# Patient Record
Sex: Male | Born: 1965 | Race: White | Hispanic: No | Smoking: Former smoker
Health system: Southern US, Community
[De-identification: ages and names within clinical notes are randomized; demographics above are authoritative.]

## PROBLEM LIST (undated history)

## (undated) DIAGNOSIS — Z8674 Personal history of sudden cardiac arrest: Secondary | ICD-10-CM

## (undated) DIAGNOSIS — F909 Attention-deficit hyperactivity disorder, unspecified type: Secondary | ICD-10-CM

## (undated) DIAGNOSIS — M419 Scoliosis, unspecified: Secondary | ICD-10-CM

## (undated) DIAGNOSIS — F319 Bipolar disorder, unspecified: Secondary | ICD-10-CM

## (undated) DIAGNOSIS — M1990 Unspecified osteoarthritis, unspecified site: Secondary | ICD-10-CM

## (undated) DIAGNOSIS — J189 Pneumonia, unspecified organism: Secondary | ICD-10-CM

## (undated) DIAGNOSIS — I209 Angina pectoris, unspecified: Secondary | ICD-10-CM

## (undated) DIAGNOSIS — I10 Essential (primary) hypertension: Secondary | ICD-10-CM

## (undated) DIAGNOSIS — S0990XA Unspecified injury of head, initial encounter: Secondary | ICD-10-CM

## (undated) DIAGNOSIS — K469 Unspecified abdominal hernia without obstruction or gangrene: Secondary | ICD-10-CM

## (undated) DIAGNOSIS — R51 Headache: Secondary | ICD-10-CM

## (undated) DIAGNOSIS — F32A Depression, unspecified: Secondary | ICD-10-CM

## (undated) DIAGNOSIS — Z8614 Personal history of Methicillin resistant Staphylococcus aureus infection: Secondary | ICD-10-CM

## (undated) DIAGNOSIS — H9192 Unspecified hearing loss, left ear: Secondary | ICD-10-CM

## (undated) DIAGNOSIS — F419 Anxiety disorder, unspecified: Secondary | ICD-10-CM

## (undated) DIAGNOSIS — K219 Gastro-esophageal reflux disease without esophagitis: Secondary | ICD-10-CM

## (undated) DIAGNOSIS — F329 Major depressive disorder, single episode, unspecified: Secondary | ICD-10-CM

## (undated) DIAGNOSIS — R569 Unspecified convulsions: Secondary | ICD-10-CM

## (undated) DIAGNOSIS — E785 Hyperlipidemia, unspecified: Secondary | ICD-10-CM

## (undated) HISTORY — PX: HERNIA REPAIR: SHX51

---

## 2012-03-20 ENCOUNTER — Emergency Department (HOSPITAL_BASED_OUTPATIENT_CLINIC_OR_DEPARTMENT_OTHER)
Admission: EM | Admit: 2012-03-20 | Discharge: 2012-03-20 | Disposition: A | Discharge status: Home / Self Care | Payer: Medicare Other | Attending: Emergency Medicine | Admitting: Emergency Medicine

## 2012-03-20 ENCOUNTER — Encounter (HOSPITAL_BASED_OUTPATIENT_CLINIC_OR_DEPARTMENT_OTHER): Payer: Self-pay | Admitting: *Deleted

## 2012-03-20 DIAGNOSIS — K219 Gastro-esophageal reflux disease without esophagitis: Secondary | ICD-10-CM | POA: Insufficient documentation

## 2012-03-20 DIAGNOSIS — F17200 Nicotine dependence, unspecified, uncomplicated: Secondary | ICD-10-CM | POA: Insufficient documentation

## 2012-03-20 DIAGNOSIS — Z79899 Other long term (current) drug therapy: Secondary | ICD-10-CM | POA: Insufficient documentation

## 2012-03-20 DIAGNOSIS — K0889 Other specified disorders of teeth and supporting structures: Secondary | ICD-10-CM

## 2012-03-20 DIAGNOSIS — K089 Disorder of teeth and supporting structures, unspecified: Secondary | ICD-10-CM | POA: Insufficient documentation

## 2012-03-20 DIAGNOSIS — Z8659 Personal history of other mental and behavioral disorders: Secondary | ICD-10-CM | POA: Insufficient documentation

## 2012-03-20 DIAGNOSIS — R6884 Jaw pain: Secondary | ICD-10-CM | POA: Insufficient documentation

## 2012-03-20 DIAGNOSIS — I10 Essential (primary) hypertension: Secondary | ICD-10-CM | POA: Insufficient documentation

## 2012-03-20 DIAGNOSIS — F319 Bipolar disorder, unspecified: Secondary | ICD-10-CM | POA: Insufficient documentation

## 2012-03-20 DIAGNOSIS — Z8639 Personal history of other endocrine, nutritional and metabolic disease: Secondary | ICD-10-CM | POA: Insufficient documentation

## 2012-03-20 DIAGNOSIS — Z862 Personal history of diseases of the blood and blood-forming organs and certain disorders involving the immune mechanism: Secondary | ICD-10-CM | POA: Insufficient documentation

## 2012-03-20 HISTORY — DX: Bipolar disorder, unspecified: F31.9

## 2012-03-20 HISTORY — DX: Gastro-esophageal reflux disease without esophagitis: K21.9

## 2012-03-20 HISTORY — DX: Essential (primary) hypertension: I10

## 2012-03-20 HISTORY — DX: Attention-deficit hyperactivity disorder, unspecified type: F90.9

## 2012-03-20 HISTORY — DX: Hyperlipidemia, unspecified: E78.5

## 2012-03-20 MED ORDER — HYDROCODONE-ACETAMINOPHEN 5-325 MG PO TABS: 2.0000 | ORAL_TABLET | ORAL | Status: DC | PRN

## 2012-03-20 MED ORDER — PENICILLIN V POTASSIUM 500 MG PO TABS: 500.0000 mg | ORAL_TABLET | Freq: Four times a day (QID) | ORAL | Status: AC

## 2012-03-20 NOTE — ED Provider Notes (Signed)
History     CSN: [id number]  Arrival date & time 03/20/12  1638   First MD Initiated Contact with Patient 03/20/12 1709      Chief Complaint  Patient presents with  . Dental Pain    (Consider location/radiation/quality/duration/timing/severity/associated sxs/prior treatment) Patient is a 47 y.o. male presenting with tooth pain. The history is provided by the patient. No language interpreter was used.  Dental PainThe primary symptoms include mouth pain and dental injury. The symptoms began 3 to 5 days ago. The symptoms are improving. The symptoms occur constantly.  Additional symptoms include: jaw pain.  Pt complains of a toothache  Past Medical History  Diagnosis Date  . Bipolar 1 disorder   . ADHD (attention deficit hyperactivity disorder)   . Hyperlipemia   . GERD (gastroesophageal reflux disease)   . Hypertension     Past Surgical History  Procedure Laterality Date  . Hernia repair      History reviewed. No pertinent family history.  History  Substance Use Topics  . Smoking status: Current Every Day Smoker -- 1.00 packs/day    Types: Cigarettes  . Smokeless tobacco: Not on file  . Alcohol Use: No      Review of Systems  HENT: Positive for dental problem.   All other systems reviewed and are negative.    Allergies  Naproxen  Home Medications   Current Outpatient Rx  Name  Route  Sig  Dispense  Refill  . cyclobenzaprine (FLEXERIL) 10 MG tablet   Oral   Take 10 mg by mouth 3 (three) times daily as needed for muscle spasms.         . divalproex (DEPAKOTE) 250 MG DR tablet   Oral   Take 250 mg by mouth 3 (three) times daily.         Marland Kitchen lisinopril (PRINIVIL,ZESTRIL) 20 MG tablet   Oral   Take 20 mg by mouth daily.         . mirtazapine (REMERON) 30 MG tablet   Oral   Take 30 mg by mouth at bedtime.         Marland Kitchen omeprazole (PRILOSEC) 20 MG capsule   Oral   Take 20 mg by mouth daily.           BP 132/88  Pulse 89  Temp(Src) 97.8 F  (36.6 C) (Oral)  Resp 16  Ht 6\' 5"  (1.956 m)  Wt 240 lb (108.863 kg)  BMI 28.45 kg/m2  SpO2 99%  Physical Exam  Nursing note and vitals reviewed. Constitutional: He appears well-developed and well-nourished.  HENT:  Head: Normocephalic and atraumatic.  Gum is erroded away from root of tooth, frontal incisor  Eyes: Pupils are equal, round, and reactive to light.  Cardiovascular: Normal rate.   Pulmonary/Chest: Effort normal.  Neurological: He is alert.    ED Course  Procedures (including critical care time)  Labs Reviewed - No data to display No results found.   No diagnosis found.    MDM  Pt advised he needs dental care.   Pt given number for Dr. Lucky Cowboy.   Pt given rx for hydrocodone and pcn.      Lonia Skinner [place], [place] 03/20/12 1734

## 2012-03-20 NOTE — ED Notes (Signed)
Pt c/o toothache x 3 days. 

## 2012-03-20 NOTE — ED Provider Notes (Signed)
Medical screening examination/treatment/procedure(s) were performed by non-physician practitioner and as supervising physician I was immediately available for consultation/collaboration.   Dione Booze, MD 03/20/12 2258

## 2012-05-12 ENCOUNTER — Emergency Department (HOSPITAL_COMMUNITY): Payer: Medicare Other | Admitting: Anesthesiology

## 2012-05-12 ENCOUNTER — Encounter (HOSPITAL_COMMUNITY)
Admission: EM | Disposition: A | Discharge status: Home / Self Care | Payer: Self-pay | Source: Home / Self Care | Attending: Emergency Medicine

## 2012-05-12 ENCOUNTER — Emergency Department (HOSPITAL_BASED_OUTPATIENT_CLINIC_OR_DEPARTMENT_OTHER): Payer: Medicare Other

## 2012-05-12 ENCOUNTER — Encounter (HOSPITAL_BASED_OUTPATIENT_CLINIC_OR_DEPARTMENT_OTHER): Payer: Self-pay | Admitting: Emergency Medicine

## 2012-05-12 ENCOUNTER — Encounter (HOSPITAL_COMMUNITY): Payer: Self-pay | Admitting: Anesthesiology

## 2012-05-12 ENCOUNTER — Observation Stay (HOSPITAL_BASED_OUTPATIENT_CLINIC_OR_DEPARTMENT_OTHER)
Admission: EM | Admit: 2012-05-12 | Discharge: 2012-05-13 | Disposition: A | Discharge status: Home / Self Care | Payer: Medicare Other | Attending: General Surgery | Admitting: General Surgery

## 2012-05-12 DIAGNOSIS — K3580 Unspecified acute appendicitis: Principal | ICD-10-CM

## 2012-05-12 DIAGNOSIS — Z79899 Other long term (current) drug therapy: Secondary | ICD-10-CM | POA: Insufficient documentation

## 2012-05-12 DIAGNOSIS — F17200 Nicotine dependence, unspecified, uncomplicated: Secondary | ICD-10-CM | POA: Insufficient documentation

## 2012-05-12 DIAGNOSIS — F909 Attention-deficit hyperactivity disorder, unspecified type: Secondary | ICD-10-CM | POA: Insufficient documentation

## 2012-05-12 DIAGNOSIS — I10 Essential (primary) hypertension: Secondary | ICD-10-CM | POA: Insufficient documentation

## 2012-05-12 DIAGNOSIS — K219 Gastro-esophageal reflux disease without esophagitis: Secondary | ICD-10-CM | POA: Insufficient documentation

## 2012-05-12 DIAGNOSIS — K4090 Unilateral inguinal hernia, without obstruction or gangrene, not specified as recurrent: Secondary | ICD-10-CM | POA: Insufficient documentation

## 2012-05-12 DIAGNOSIS — E785 Hyperlipidemia, unspecified: Secondary | ICD-10-CM | POA: Insufficient documentation

## 2012-05-12 DIAGNOSIS — F319 Bipolar disorder, unspecified: Secondary | ICD-10-CM | POA: Insufficient documentation

## 2012-05-12 DIAGNOSIS — R5381 Other malaise: Secondary | ICD-10-CM | POA: Insufficient documentation

## 2012-05-12 HISTORY — PX: LAPAROSCOPIC APPENDECTOMY: SHX408

## 2012-05-12 HISTORY — DX: Unspecified injury of head, initial encounter: S09.90XA

## 2012-05-12 LAB — URINE MICROSCOPIC-ADD ON

## 2012-05-12 LAB — CBC WITH DIFFERENTIAL/PLATELET
Lymphocytes Relative: 13 % (ref 12–46)
Lymphs Abs: 2.6 10*3/uL (ref 0.7–4.0)
Neutrophils Relative %: 80 % — ABNORMAL HIGH (ref 43–77)
Platelets: 262 10*3/uL (ref 150–400)
RBC: 5.25 MIL/uL (ref 4.22–5.81)
WBC: 20.6 10*3/uL — ABNORMAL HIGH (ref 4.0–10.5)

## 2012-05-12 LAB — BASIC METABOLIC PANEL
CO2: 27 mEq/L (ref 19–32)
GFR calc non Af Amer: 72 mL/min — ABNORMAL LOW (ref >90)
Glucose, Bld: 127 mg/dL — ABNORMAL HIGH (ref 70–99)
Potassium: 4.2 mEq/L (ref 3.5–5.1)
Sodium: 138 mEq/L (ref 135–145)

## 2012-05-12 LAB — URINALYSIS, ROUTINE W REFLEX MICROSCOPIC
Glucose, UA: NEGATIVE mg/dL
Hgb urine dipstick: NEGATIVE
Specific Gravity, Urine: 1.026 (ref 1.005–1.030)
Urobilinogen, UA: 0.2 mg/dL (ref 0.0–1.0)

## 2012-05-12 SURGERY — APPENDECTOMY, LAPAROSCOPIC
Anesthesia: General | Laterality: Right | Wound class: Clean

## 2012-05-12 MED ORDER — HYDROMORPHONE HCL PF 1 MG/ML IJ SOLN
1.0000 mg | Freq: Once | INTRAMUSCULAR | Status: AC
  Administered 2012-05-12: 1 mg via INTRAVENOUS
  Filled 2012-05-12: qty 1

## 2012-05-12 MED ORDER — DEXAMETHASONE SODIUM PHOSPHATE 10 MG/ML IJ SOLN
INTRAMUSCULAR | Status: DC | PRN
  Administered 2012-05-12: 10 mg via INTRAVENOUS

## 2012-05-12 MED ORDER — BUPIVACAINE-EPINEPHRINE 0.5% -1:200000 IJ SOLN
INTRAMUSCULAR | Status: AC
  Filled 2012-05-12: qty 1

## 2012-05-12 MED ORDER — MIDAZOLAM HCL 5 MG/5ML IJ SOLN
INTRAMUSCULAR | Status: DC | PRN
  Administered 2012-05-12: 2 mg via INTRAVENOUS

## 2012-05-12 MED ORDER — BUPIVACAINE-EPINEPHRINE 0.25% -1:200000 IJ SOLN
INTRAMUSCULAR | Status: AC
  Filled 2012-05-12: qty 1

## 2012-05-12 MED ORDER — FENTANYL CITRATE 0.05 MG/ML IJ SOLN
INTRAMUSCULAR | Status: DC | PRN
  Administered 2012-05-12: 100 ug via INTRAVENOUS
  Administered 2012-05-12: 50 ug via INTRAVENOUS

## 2012-05-12 MED ORDER — LACTATED RINGERS IV SOLN
INTRAVENOUS | Status: DC | PRN
  Administered 2012-05-12 – 2012-05-13 (×2): via INTRAVENOUS

## 2012-05-12 MED ORDER — BUPIVACAINE-EPINEPHRINE 0.5% -1:200000 IJ SOLN
INTRAMUSCULAR | Status: DC | PRN
  Administered 2012-05-12: 15 mL

## 2012-05-12 MED ORDER — ONDANSETRON HCL 4 MG/2ML IJ SOLN
INTRAMUSCULAR | Status: DC | PRN
  Administered 2012-05-12: 4 mg via INTRAVENOUS

## 2012-05-12 MED ORDER — PROPOFOL 10 MG/ML IV BOLUS
INTRAVENOUS | Status: DC | PRN
  Administered 2012-05-12: 200 mg via INTRAVENOUS

## 2012-05-12 MED ORDER — IOHEXOL 300 MG/ML  SOLN
50.0000 mL | Freq: Once | INTRAMUSCULAR | Status: AC | PRN
  Administered 2012-05-12: 50 mL via ORAL

## 2012-05-12 MED ORDER — SODIUM CHLORIDE 0.9 % IR SOLN
Status: DC | PRN
  Administered 2012-05-12: 1000 mL

## 2012-05-12 MED ORDER — CISATRACURIUM BESYLATE (PF) 10 MG/5ML IV SOLN
INTRAVENOUS | Status: DC | PRN
  Administered 2012-05-12: 5 mg via INTRAVENOUS

## 2012-05-12 MED ORDER — IOHEXOL 300 MG/ML  SOLN
100.0000 mL | Freq: Once | INTRAMUSCULAR | Status: AC | PRN
  Administered 2012-05-12: 100 mL via INTRAVENOUS

## 2012-05-12 MED ORDER — ONDANSETRON HCL 4 MG/2ML IJ SOLN
4.0000 mg | Freq: Once | INTRAMUSCULAR | Status: AC
  Administered 2012-05-12: 4 mg via INTRAVENOUS
  Filled 2012-05-12: qty 2

## 2012-05-12 MED ORDER — HYDROMORPHONE HCL PF 1 MG/ML IJ SOLN
INTRAMUSCULAR | Status: AC
  Filled 2012-05-12: qty 1

## 2012-05-12 MED ORDER — 0.9 % SODIUM CHLORIDE (POUR BTL) OPTIME
TOPICAL | Status: DC | PRN
  Administered 2012-05-12: 1000 mL

## 2012-05-12 MED ORDER — DEXTROSE 5 % IV SOLN
2.0000 g | INTRAVENOUS | Status: AC
  Administered 2012-05-12: 2 g via INTRAVENOUS
  Filled 2012-05-12 (×2): qty 2

## 2012-05-12 MED ORDER — HYDROMORPHONE HCL PF 1 MG/ML IJ SOLN
1.0000 mg | Freq: Once | INTRAMUSCULAR | Status: AC
  Administered 2012-05-12: 1 mg via INTRAVENOUS

## 2012-05-12 MED ORDER — SUCCINYLCHOLINE CHLORIDE 20 MG/ML IJ SOLN
INTRAMUSCULAR | Status: DC | PRN
  Administered 2012-05-12: 100 mg via INTRAVENOUS

## 2012-05-12 MED ORDER — SODIUM CHLORIDE 0.9 % IV BOLUS (SEPSIS)
1000.0000 mL | Freq: Once | INTRAVENOUS | Status: AC
  Administered 2012-05-12: 1000 mL via INTRAVENOUS

## 2012-05-12 SURGICAL SUPPLY — 38 items
APPLIER CLIP 5 13 M/L LIGAMAX5 (MISCELLANEOUS)
APPLIER CLIP ROT 10 11.4 M/L (STAPLE)
CANISTER SUCTION 2500CC (MISCELLANEOUS) ×2 IMPLANT
CHLORAPREP W/TINT 26ML (MISCELLANEOUS) ×2 IMPLANT
CLIP APPLIE 5 13 M/L LIGAMAX5 (MISCELLANEOUS) IMPLANT
CLIP APPLIE ROT 10 11.4 M/L (STAPLE) IMPLANT
CLOTH BEACON ORANGE TIMEOUT ST (SAFETY) ×2 IMPLANT
CORD HIGH FREQUENCY UNIPOLAR (ELECTROSURGICAL) ×2 IMPLANT
CUTTER FLEX LINEAR 45M (STAPLE) ×2 IMPLANT
DECANTER SPIKE VIAL GLASS SM (MISCELLANEOUS) ×2 IMPLANT
DERMABOND ADVANCED (GAUZE/BANDAGES/DRESSINGS)
DERMABOND ADVANCED .7 DNX12 (GAUZE/BANDAGES/DRESSINGS) IMPLANT
DRAPE LAPAROSCOPIC ABDOMINAL (DRAPES) ×2 IMPLANT
DRAPE UTILITY W/TAPE 26X15 (DRAPES) ×2 IMPLANT
DRAPE UTILITY XL STRL (DRAPES) ×2 IMPLANT
ELECT REM PT RETURN 9FT ADLT (ELECTROSURGICAL) ×2
ELECTRODE REM PT RTRN 9FT ADLT (ELECTROSURGICAL) ×1 IMPLANT
GLOVE BIO SURGEON STRL SZ 6.5 (GLOVE) ×2 IMPLANT
GLOVE BIOGEL PI IND STRL 7.0 (GLOVE) ×1 IMPLANT
GLOVE BIOGEL PI INDICATOR 7.0 (GLOVE) ×1
GOWN STRL REIN 2XL XLG LVL4 (GOWN DISPOSABLE) ×2 IMPLANT
GOWN STRL REIN XL XLG (GOWN DISPOSABLE) ×2 IMPLANT
IV LACTATED RINGERS 1000ML (IV SOLUTION) ×2 IMPLANT
KIT BASIN OR (CUSTOM PROCEDURE TRAY) ×2 IMPLANT
POUCH SPECIMEN RETRIEVAL 10MM (ENDOMECHANICALS) ×2 IMPLANT
RELOAD 45 VASCULAR/THIN (ENDOMECHANICALS) ×2 IMPLANT
RELOAD STAPLE TA45 3.5 REG BLU (ENDOMECHANICALS) ×2 IMPLANT
SCALPEL HARMONIC ACE (MISCELLANEOUS) IMPLANT
SET IRRIG TUBING LAPAROSCOPIC (IRRIGATION / IRRIGATOR) ×2 IMPLANT
SOLUTION ANTI FOG 6CC (MISCELLANEOUS) ×2 IMPLANT
SUT MNCRL AB 4-0 PS2 18 (SUTURE) IMPLANT
TOWEL OR 17X26 10 PK STRL BLUE (TOWEL DISPOSABLE) ×2 IMPLANT
TRAY FOLEY CATH 14FRSI W/METER (CATHETERS) ×2 IMPLANT
TRAY LAP CHOLE (CUSTOM PROCEDURE TRAY) ×2 IMPLANT
TROCAR BLADELESS OPT 5 75 (ENDOMECHANICALS) ×4 IMPLANT
TROCAR XCEL 12X100 BLDLESS (ENDOMECHANICALS) IMPLANT
TROCAR XCEL BLUNT TIP 100MML (ENDOMECHANICALS) ×2 IMPLANT
TUBING INSUFFLATION 10FT LAP (TUBING) ×2 IMPLANT

## 2012-05-12 NOTE — ED Notes (Signed)
Report to [facility] 

## 2012-05-12 NOTE — ED Provider Notes (Signed)
Patient received in transfer from med center [facility], feels ongoing right lower quadrant pain, general surgery to be paged, the patient appears well otherwise.  Vida Roller, MD 05/12/12 2046

## 2012-05-12 NOTE — ED Notes (Signed)
Pt requested pain med-EDP notified-orders received

## 2012-05-12 NOTE — ED Notes (Signed)
abd pain 6/10-EDP notified-orders received for pain med

## 2012-05-12 NOTE — H&P (Signed)
Miguel Aguilar is an 47 y.o. male.   Chief Complaint: RLQ pain HPI: Pt reports waking up with abd pain that worsened and located to his RLQ this afternoon.  He denies any fevers or chills, nausea, vomiting or dysuria.  Past Medical History  Diagnosis Date  . Bipolar 1 disorder   . ADHD (attention deficit hyperactivity disorder)   . Hyperlipemia   . GERD (gastroesophageal reflux disease)   . Hypertension   . Head trauma approx 1982    Past Surgical History  Procedure Laterality Date  . Hernia repair      total of 12 surgeries    History reviewed. No pertinent family history. Social History:  reports that he has been smoking Cigarettes.  He has a 9 pack-year smoking history. He quit smokeless tobacco use about 19 years ago. His smokeless tobacco use included Chew. He reports that he uses illicit drugs (Marijuana). He reports that he does not drink alcohol.  Allergies:  Allergies  Allergen Reactions  . Aspirin Anaphylaxis  . Naproxen Anaphylaxis  . Ibuprofen      (Not in a hospital admission)  Results for orders placed during the hospital encounter of 05/12/12 (from the past 48 hour(s))  CBC WITH DIFFERENTIAL     Status: Abnormal   Collection Time    05/12/12  5:40 PM      Result Value Range   WBC 20.6 (*) 4.0 - 10.5 K/uL   RBC 5.25  4.22 - 5.81 MIL/uL   Hemoglobin 16.6  13.0 - 17.0 g/dL   HCT 16.1  09.6 - 04.5 %   MCV 90.7  78.0 - 100.0 fL   MCH 31.6  26.0 - 34.0 pg   MCHC 34.9  30.0 - 36.0 g/dL   RDW 40.9  81.1 - 91.4 %   Platelets 262  150 - 400 K/uL   Neutrophils Relative 80 (*) 43 - 77 %   Neutro Abs 16.3 (*) 1.7 - 7.7 K/uL   Lymphocytes Relative 13  12 - 46 %   Lymphs Abs 2.6  0.7 - 4.0 K/uL   Monocytes Relative 8  3 - 12 %   Monocytes Absolute 1.6 (*) 0.1 - 1.0 K/uL   Eosinophils Relative 0  0 - 5 %   Eosinophils Absolute 0.1  0.0 - 0.7 K/uL   Basophils Relative 0  0 - 1 %   Basophils Absolute 0.0  0.0 - 0.1 K/uL  BASIC METABOLIC PANEL     Status:  Abnormal   Collection Time    05/12/12  5:40 PM      Result Value Range   Sodium 138  135 - 145 mEq/L   Potassium 4.2  3.5 - 5.1 mEq/L   Chloride 99  96 - 112 mEq/L   CO2 27  19 - 32 mEq/L   Glucose, Bld 127 (*) 70 - 99 mg/dL   BUN 16  6 - 23 mg/dL   Creatinine, Ser 7.82  0.50 - 1.35 mg/dL   Calcium 95.6  8.4 - 21.3 mg/dL   GFR calc non Af Amer 72 (*) >90 mL/min   GFR calc Af Amer 83 (*) >90 mL/min   Comment:            The eGFR has been calculated     using the CKD EPI equation.     This calculation has not been     validated in all clinical     situations.     eGFR's persistently     <  90 mL/min signify     possible Chronic Kidney Disease.  URINALYSIS, ROUTINE W REFLEX MICROSCOPIC     Status: Abnormal   Collection Time    05/12/12  6:00 PM      Result Value Range   Color, Urine YELLOW  YELLOW   APPearance CLEAR  CLEAR   Specific Gravity, Urine 1.026  1.005 - 1.030   pH 5.0  5.0 - 8.0   Glucose, UA NEGATIVE  NEGATIVE mg/dL   Hgb urine dipstick NEGATIVE  NEGATIVE   Bilirubin Urine NEGATIVE  NEGATIVE   Ketones, ur NEGATIVE  NEGATIVE mg/dL   Protein, ur NEGATIVE  NEGATIVE mg/dL   Urobilinogen, UA 0.2  0.0 - 1.0 mg/dL   Nitrite NEGATIVE  NEGATIVE   Leukocytes, UA TRACE (*) NEGATIVE  URINE MICROSCOPIC-ADD ON     Status: None   Collection Time    05/12/12  6:00 PM      Result Value Range   Squamous Epithelial / LPF RARE  RARE   WBC, UA 0-2  <3 WBC/hpf   RBC / HPF 0-2  <3 RBC/hpf   Bacteria, UA RARE  RARE   Urine-Other MUCOUS PRESENT     Ct Abdomen Pelvis W Contrast  05/12/2012  *RADIOLOGY REPORT*  Clinical Data: Right lower quadrant abdominal pain and history of prior inguinal hernia repair.  CT ABDOMEN AND PELVIS WITH CONTRAST  Technique:  Multidetector CT imaging of the abdomen and pelvis was performed following the standard protocol during bolus administration of intravenous contrast.  Contrast: 50mL OMNIPAQUE IOHEXOL 300 MG/ML  SOLN, OMNIPAQUE IOHEXOL 300 MG/ML   SOLN  Comparison: None.  Findings: There is evidence of thickening of the appendix with mild surrounding inflammatory changes consistent with appendicitis. Maximal diameter of the appendix is 11 mm.  No evidence of appendiceal rupture by CT or focal abscess.  No appendicolith is identified.  No extraluminal free air is identified.  Other bowel loops are unremarkable.  The liver, gallbladder, pancreas, spleen, adrenal glands and kidneys are within normal limits.  No free fluid or focal abscess is identified.  In the pelvis, the bladder is unremarkable.  There is a small right inguinal hernia containing fat.  Bony structures are unremarkable.  IMPRESSION: Thickening of the appendix to 11 mm with mild surrounding inflammatory changes.  Findings are consistent with appendicitis. There is no evidence of focal rupture or abscess.   Original Report Authenticated By: Irish Lack, M.D.     Review of Systems  Constitutional: Positive for malaise/fatigue. Negative for fever and chills.  Eyes: Negative for blurred vision.  Respiratory: Negative for cough and shortness of breath.   Cardiovascular: Negative for chest pain and palpitations.  Gastrointestinal: Positive for abdominal pain. Negative for nausea, vomiting, diarrhea and constipation.  Genitourinary: Negative for dysuria, frequency and hematuria.  Skin: Negative for rash.  Neurological: Negative for headaches.    Blood pressure 129/74, pulse 72, temperature 98.3 F (36.8 C), temperature source Oral, resp. rate 18, height 6\' 5"  (1.956 m), weight 238 lb (107.956 kg), SpO2 94.00%. Physical Exam  Constitutional: He is oriented to person, place, and time. He appears well-developed and well-nourished. No distress.  HENT:  Head: Normocephalic and atraumatic.  Eyes: Conjunctivae are normal. Pupils are equal, round, and reactive to light.  Neck: Normal range of motion.  Cardiovascular: Normal rate and regular rhythm.   Respiratory: Effort normal.  GI:  Soft. Bowel sounds are normal. He exhibits no distension and no mass. There is tenderness. There is  guarding.  RLQ  Neurological: He is alert and oriented to person, place, and time.  Skin: Skin is warm and dry. He is not diaphoretic.     Assessment/Plan Mylz Yuan is a 47 y.o. M with RLQ pain consistent with acute appendicitis.  His CT confirms this.  The risks and benefits of laparoscopic appendectomy were explained to the patient.  These risks include bleeding, infection, leak and abscess.  I believe he understands these risks and has agreed to proceed to the OR.  Berwyn Bigley C. 05/12/2012, 10:22 PM

## 2012-05-12 NOTE — ED Notes (Signed)
Sudden onset of RLQ pain this am after waking up.  Nausea, no vomiting.  No diarrhea.  No fever.  Hasn't taken any OTC pain relievers.

## 2012-05-12 NOTE — ED Provider Notes (Signed)
History     CSN: [id number]  Arrival date & time 05/12/12  1709   First MD Initiated Contact with Patient 05/12/12 1710      Chief Complaint  Patient presents with  . Abdominal Pain    (Consider location/radiation/quality/duration/timing/severity/associated sxs/prior treatment) Patient is a 47 y.o. male presenting with abdominal pain.  Abdominal Pain  Pt reports he woke up this morning with moderate to severe aching RLQ pain, worse with movement and associated with nausea. No fever, vomiting, diarrhea, dysuria. He was recently assessed by a nephrologist after referral from his PCP for ?poteinuria but workup there was neg. No back or flank pain today but had some 2 weeks ago.  Past Medical History  Diagnosis Date  . Bipolar 1 disorder   . ADHD (attention deficit hyperactivity disorder)   . Hyperlipemia   . GERD (gastroesophageal reflux disease)   . Hypertension     Past Surgical History  Procedure Laterality Date  . Hernia repair      No family history on file.  History  Substance Use Topics  . Smoking status: Current Every Day Smoker -- 1.00 packs/day    Types: Cigarettes  . Smokeless tobacco: Not on file  . Alcohol Use: No      Review of Systems  Gastrointestinal: Positive for abdominal pain.   All other systems reviewed and are negative except as noted in HPI.   Allergies  Naproxen and Ibuprofen  Home Medications   Current Outpatient Rx  Name  Route  Sig  Dispense  Refill  . amLODipine (NORVASC) 2.5 MG tablet   Oral   Take 2.5 mg by mouth daily.         Marland Kitchen gabapentin (NEURONTIN) 400 MG capsule   Oral   Take 400 mg by mouth 3 (three) times daily.         . hydrOXYzine (ATARAX/VISTARIL) 50 MG tablet   Oral   Take 50 mg by mouth QID.         Marland Kitchen pravastatin (PRAVACHOL) 20 MG tablet   Oral   Take 20 mg by mouth daily.         . cyclobenzaprine (FLEXERIL) 10 MG tablet   Oral   Take 10 mg by mouth 3 (three) times daily as needed for muscle  spasms.         . divalproex (DEPAKOTE) 250 MG DR tablet   Oral   Take 250 mg by mouth 3 (three) times daily.         Marland Kitchen HYDROcodone-acetaminophen (NORCO/VICODIN) 5-325 MG per tablet   Oral   Take 2 tablets by mouth every 4 (four) hours as needed for pain.   16 tablet   0   . lisinopril (PRINIVIL,ZESTRIL) 20 MG tablet   Oral   Take 20 mg by mouth daily.         . mirtazapine (REMERON) 30 MG tablet   Oral   Take 30 mg by mouth at bedtime.         Marland Kitchen omeprazole (PRILOSEC) 20 MG capsule   Oral   Take 20 mg by mouth daily.           BP 162/96  Pulse 85  Temp(Src) 98.3 F (36.8 C) (Oral)  Resp 18  Ht 6\' 5"  (1.956 m)  Wt 238 lb (107.956 kg)  BMI 28.22 kg/m2  SpO2 96%  Physical Exam  Nursing note and vitals reviewed. Constitutional: He is oriented to person, place, and time. He appears well-developed and  well-nourished.  HENT:  Head: Normocephalic and atraumatic.  Eyes: EOM are normal. Pupils are equal, round, and reactive to light.  Neck: Normal range of motion. Neck supple.  Cardiovascular: Normal rate, normal heart sounds and intact distal pulses.   Pulmonary/Chest: Effort normal and breath sounds normal.  Abdominal: Bowel sounds are normal. He exhibits no distension. There is tenderness (RLQ, moderate). There is guarding. There is no rebound.  Musculoskeletal: Normal range of motion. He exhibits no edema and no tenderness.  Neurological: He is alert and oriented to person, place, and time. He has normal strength. No cranial nerve deficit or sensory deficit.  Skin: Skin is warm and dry. No rash noted.  Psychiatric: He has a normal mood and affect.    ED Course  Procedures (including critical care time)  Labs Reviewed  CBC WITH DIFFERENTIAL - Abnormal; Notable for the following:    WBC 20.6 (*)    Neutrophils Relative 80 (*)    Neutro Abs 16.3 (*)    Monocytes Absolute 1.6 (*)    All other components within normal limits  BASIC METABOLIC PANEL -  Abnormal; Notable for the following:    Glucose, Bld 127 (*)    GFR calc non Af Amer 72 (*)    GFR calc Af Amer 83 (*)    All other components within normal limits  URINALYSIS, ROUTINE W REFLEX MICROSCOPIC - Abnormal; Notable for the following:    Leukocytes, UA TRACE (*)    All other components within normal limits  URINE MICROSCOPIC-ADD ON   Ct Abdomen Pelvis W Contrast  05/12/2012  *RADIOLOGY REPORT*  Clinical Data: Right lower quadrant abdominal pain and history of prior inguinal hernia repair.  CT ABDOMEN AND PELVIS WITH CONTRAST  Technique:  Multidetector CT imaging of the abdomen and pelvis was performed following the standard protocol during bolus administration of intravenous contrast.  Contrast: 50mL OMNIPAQUE IOHEXOL 300 MG/ML  SOLN, OMNIPAQUE IOHEXOL 300 MG/ML  SOLN  Comparison: None.  Findings: There is evidence of thickening of the appendix with mild surrounding inflammatory changes consistent with appendicitis. Maximal diameter of the appendix is 11 mm.  No evidence of appendiceal rupture by CT or focal abscess.  No appendicolith is identified.  No extraluminal free air is identified.  Other bowel loops are unremarkable.  The liver, gallbladder, pancreas, spleen, adrenal glands and kidneys are within normal limits.  No free fluid or focal abscess is identified.  In the pelvis, the bladder is unremarkable.  There is a small right inguinal hernia containing fat.  Bony structures are unremarkable.  IMPRESSION: Thickening of the appendix to 11 mm with mild surrounding inflammatory changes.  Findings are consistent with appendicitis. There is no evidence of focal rupture or abscess.   Original Report Authenticated By: Irish Lack, M.D.      1. Acute appendicitis       MDM  Reviewed CT with patient and mother. Spoke with Dr. Maisie Fus on call for General Surgery who will accept the patient to the [facility] ED for evaluation. Dr. Hyacinth Meeker in the ED also aware.        Jervon Ream B.  Bernette Mayers, MD 05/12/12 4132

## 2012-05-12 NOTE — ED Notes (Signed)
[id number]<AV> Expected date:05/12/12<BR> Expected time: 7:56 PM<BR> Means of arrival:Ambulance<BR> Comments:<BR> Hold for transfer from [facility]: [place]

## 2012-05-12 NOTE — Anesthesia Preprocedure Evaluation (Signed)
Anesthesia Evaluation  Patient identified by MRN, date of birth, ID band Patient awake    Reviewed: Allergy & Precautions, H&P , NPO status , Patient's Chart, lab work & pertinent test results  Airway Mallampati: II TM Distance: >3 FB Neck ROM: Full    Dental  (+) Poor Dentition, Loose and Missing,    Pulmonary Current Smoker,  breath sounds clear to auscultation  Pulmonary exam normal       Cardiovascular hypertension, Pt. on medications Rhythm:Regular Rate:Normal     Neuro/Psych Seizures -, Well Controlled,  negative psych ROS   GI/Hepatic Neg liver ROS, GERD-  ,  Endo/Other  negative endocrine ROS  Renal/GU negative Renal ROS  negative genitourinary   Musculoskeletal negative musculoskeletal ROS (+)   Abdominal   Peds  Hematology negative hematology ROS (+)   Anesthesia Other Findings   Reproductive/Obstetrics                           Anesthesia Physical Anesthesia Plan  ASA: II and emergent  Anesthesia Plan: General   Post-op Pain Management:    Induction: Intravenous, Rapid sequence and Cricoid pressure planned  Airway Management Planned: Oral ETT  Additional Equipment:   Intra-op Plan:   Post-operative Plan: Extubation in OR  Informed Consent: I have reviewed the patients History and Physical, chart, labs and discussed the procedure including the risks, benefits and alternatives for the proposed anesthesia with the patient or authorized representative who has indicated his/her understanding and acceptance.   Dental advisory given  Plan Discussed with: CRNA  Anesthesia Plan Comments:         Anesthesia Quick Evaluation

## 2012-05-13 DIAGNOSIS — K3580 Unspecified acute appendicitis: Secondary | ICD-10-CM

## 2012-05-13 LAB — BASIC METABOLIC PANEL
CO2: 27 mEq/L (ref 19–32)
Calcium: 10 mg/dL (ref 8.4–10.5)
GFR calc non Af Amer: 72 mL/min — ABNORMAL LOW (ref >90)
Glucose, Bld: 156 mg/dL — ABNORMAL HIGH (ref 70–99)
Potassium: 3.9 mEq/L (ref 3.5–5.1)
Sodium: 135 mEq/L (ref 135–145)

## 2012-05-13 LAB — CBC
Hemoglobin: 16.4 g/dL (ref 13.0–17.0)
Platelets: 250 10*3/uL (ref 150–400)
RBC: 5.21 MIL/uL (ref 4.22–5.81)
WBC: 15.5 10*3/uL — ABNORMAL HIGH (ref 4.0–10.5)

## 2012-05-13 MED ORDER — ONDANSETRON HCL 4 MG PO TABS: 4.0000 mg | ORAL_TABLET | Freq: Four times a day (QID) | ORAL | Status: DC | PRN

## 2012-05-13 MED ORDER — MIRTAZAPINE 30 MG PO TABS
30.0000 mg | ORAL_TABLET | Freq: Every day | ORAL | Status: DC
  Filled 2012-05-13 (×2): qty 1

## 2012-05-13 MED ORDER — HYDROMORPHONE HCL PF 1 MG/ML IJ SOLN
INTRAMUSCULAR | Status: AC
  Filled 2012-05-13: qty 1

## 2012-05-13 MED ORDER — OXYCODONE-ACETAMINOPHEN 5-325 MG PO TABS
1.0000 | ORAL_TABLET | ORAL | Status: DC | PRN
  Administered 2012-05-13: 1 via ORAL
  Administered 2012-05-13: 2 via ORAL
  Filled 2012-05-13 (×2): qty 2

## 2012-05-13 MED ORDER — AMLODIPINE BESYLATE 2.5 MG PO TABS
2.5000 mg | ORAL_TABLET | Freq: Every day | ORAL | Status: DC
  Filled 2012-05-13: qty 1

## 2012-05-13 MED ORDER — HYDROMORPHONE HCL PF 1 MG/ML IJ SOLN
0.2500 mg | INTRAMUSCULAR | Status: DC | PRN
  Administered 2012-05-13: 0.25 mg via INTRAVENOUS
  Administered 2012-05-13: 0.5 mg via INTRAVENOUS
  Administered 2012-05-13: 0.25 mg via INTRAVENOUS

## 2012-05-13 MED ORDER — HEPARIN SODIUM (PORCINE) 5000 UNIT/ML IJ SOLN
5000.0000 [IU] | Freq: Three times a day (TID) | INTRAMUSCULAR | Status: DC
  Administered 2012-05-13: 5000 [IU] via SUBCUTANEOUS
  Filled 2012-05-13 (×4): qty 1

## 2012-05-13 MED ORDER — DIVALPROEX SODIUM 250 MG PO DR TAB
250.0000 mg | DELAYED_RELEASE_TABLET | Freq: Three times a day (TID) | ORAL | Status: DC
Start: 2012-05-13 — End: 2012-05-13
  Administered 2012-05-13: 250 mg via ORAL
  Filled 2012-05-13 (×3): qty 1

## 2012-05-13 MED ORDER — NEOSTIGMINE METHYLSULFATE 1 MG/ML IJ SOLN
INTRAMUSCULAR | Status: DC | PRN
  Administered 2012-05-13: 5 mg via INTRAVENOUS

## 2012-05-13 MED ORDER — PROMETHAZINE HCL 25 MG/ML IJ SOLN: 6.2500 mg | INTRAMUSCULAR | Status: DC | PRN

## 2012-05-13 MED ORDER — HYDROXYZINE HCL 50 MG PO TABS
50.0000 mg | ORAL_TABLET | Freq: Four times a day (QID) | ORAL | Status: DC
  Administered 2012-05-13: 50 mg via ORAL
  Filled 2012-05-13 (×5): qty 1

## 2012-05-13 MED ORDER — HYDROXYZINE HCL 50 MG PO TABS
50.0000 mg | ORAL_TABLET | Freq: Four times a day (QID) | ORAL | Status: DC
  Filled 2012-05-13: qty 1

## 2012-05-13 MED ORDER — GABAPENTIN 400 MG PO CAPS
400.0000 mg | ORAL_CAPSULE | Freq: Three times a day (TID) | ORAL | Status: DC
  Administered 2012-05-13: 400 mg via ORAL
  Filled 2012-05-13 (×3): qty 1

## 2012-05-13 MED ORDER — GLYCOPYRROLATE 0.2 MG/ML IJ SOLN
INTRAMUSCULAR | Status: DC | PRN
  Administered 2012-05-13: .8 mg via INTRAVENOUS

## 2012-05-13 MED ORDER — SODIUM CHLORIDE 0.9 % IV SOLN
INTRAVENOUS | Status: DC
  Administered 2012-05-13: 01:00:00 via INTRAVENOUS

## 2012-05-13 MED ORDER — SIMVASTATIN 10 MG PO TABS
10.0000 mg | ORAL_TABLET | Freq: Every day | ORAL | Status: DC
Start: 2012-05-13 — End: 2012-05-13
  Filled 2012-05-13: qty 1

## 2012-05-13 MED ORDER — ONDANSETRON HCL 4 MG/2ML IJ SOLN: 4.0000 mg | Freq: Four times a day (QID) | INTRAMUSCULAR | Status: DC | PRN

## 2012-05-13 MED ORDER — ACETAMINOPHEN 325 MG PO TABS: 650.0000 mg | ORAL_TABLET | Freq: Four times a day (QID) | ORAL | Status: DC | PRN

## 2012-05-13 MED ORDER — MORPHINE SULFATE 2 MG/ML IJ SOLN: 2.0000 mg | INTRAMUSCULAR | Status: DC | PRN

## 2012-05-13 MED ORDER — HYDROCODONE-ACETAMINOPHEN 5-325 MG PO TABS: 1.0000 | ORAL_TABLET | ORAL | Status: DC | PRN

## 2012-05-13 MED ORDER — LACTATED RINGERS IV SOLN: INTRAVENOUS | Status: DC

## 2012-05-13 MED ORDER — PANTOPRAZOLE SODIUM 40 MG PO TBEC
40.0000 mg | DELAYED_RELEASE_TABLET | Freq: Every day | ORAL | Status: DC
  Administered 2012-05-13: 40 mg via ORAL
  Filled 2012-05-13: qty 1

## 2012-05-13 NOTE — Discharge Summary (Signed)
Physician Discharge Summary [facility]  Patient ID: Miguel Aguilar MRN: [id number] DOB/AGE: [birth year] 46 y.o.  Admit date: 05/12/2012 Discharge date: 05/13/2012  Admission Diagnoses:  Acute appendicitis  Discharge Diagnoses:  Principal Problem:   Acute appendicitis   Discharged Condition: good  Hospital Course: patient admitted for observation after lap appendectomy.  Did well.  Tolerated regular diet.  Voiding.  Ambulating.  Wishes to go home this morning with mother.  Consults: None  Significant Diagnostic Studies: CTA  Treatments: surgery: lap appendectomy  Discharge Exam: Blood pressure 108/64, pulse 70, temperature 98.1 F (36.7 C), temperature source Oral, resp. rate 16, height 6\' 5"  (1.956 m), weight 238 lb (107.956 kg), SpO2 97.00%. HEENT - clear Chest - clear bilat Cor - RRR Abd - mild distension; BS present; wounds clear and dry with Dermabond  Disposition: Home with family  Discharge Orders   Future Orders Complete By Expires     Diet - low sodium heart healthy  As directed     Discharge instructions  As directed     Comments:      [facility]  LAPAROSCOPIC SURGERY - POST-OP INSTRUCTIONS  Always review your discharge instruction sheet given to you by the facility where your surgery was performed.  A prescription for pain medication may be given to you upon discharge.  Take your pain medication as prescribed.  If narcotic pain medicine is not needed, then you may take acetaminophen (Tylenol) or ibuprofen (Advil) as needed.  Take your usually prescribed medications unless otherwise directed.  If you need a refill on your pain medication, please contact your pharmacy.  They will contact our office to request authorization. Prescriptions will not be filled after 5 P.M. or on weekends.  You should follow a light diet the first few days after arrival home, such as soup and crackers or toast.  Be sure to include plenty of  fluids daily.  Most patients will experience some swelling and bruising in the area of the incisions.  Ice packs will help.  Swelling and bruising can take several days to resolve.   It is common to experience some constipation if taking pain medication after surgery.  Increasing fluid intake and taking a stool softener (such as Colace) will usually help or prevent this problem from occurring.  A mild laxative (Milk of Magnesia or Miralax) should be taken according to package instructions if there are no bowel movements after 48 hours.  Unless discharge instructions indicate otherwise, you may remove your bandages 24-48 hours after surgery, and you may shower at that time.  You may have steri-strips (small skin tapes) in place directly over the incision.  These strips should be left on the skin for 7-10 days.  If your surgeon used skin glue on the incision, you may shower in 24 hours.  The glue will flake off over the next 2-3 weeks.  Any sutures or staples will be removed at the office during your follow-up visit.  ACTIVITIES:  You may resume regular (light) daily activities beginning the next day-such as daily self-care, walking, climbing stairs-gradually increasing activities as tolerated.  You may have sexual intercourse when it is comfortable.  Refrain from any heavy lifting or straining until approved by your doctor.  You may drive when you are no longer taking prescription pain medication, you can comfortably wear a seatbelt, and you can safely maneuver your car and apply brakes.  You should see your doctor in the office for a follow-up  appointment approximately 2-3 weeks after your surgery.  Make sure that you call for this appointment within a day or two after you arrive home to insure a convenient appointment time.  WHEN TO CALL YOUR DOCTOR: Fever over 101.0 Inability to urinate Continued bleeding from incision Increased pain, redness, or drainage from the incision Increasing abdominal  pain  The clinic staff is available to answer your questions during regular business hours.  Please don't hesitate to call and ask to speak to one of the nurses for clinical concerns.  If you have a medical emergency, go to the nearest emergency room or call 911.  A surgeon from [facility] is always on call for the hospital.  Velora Heckler, MD, [facility] Office: [phone] Toll Free:  [phone] FAX [phone]  Web site: [email or web address]    Increase activity slowly  As directed     No dressing needed  As directed         Medication List    TAKE these medications       amLODipine 2.5 MG tablet  Commonly known as:  NORVASC  Take 2.5 mg by mouth daily.     cyclobenzaprine 10 MG tablet  Commonly known as:  FLEXERIL  Take 10 mg by mouth 3 (three) times daily as needed for muscle spasms.     divalproex 250 MG DR tablet  Commonly known as:  DEPAKOTE  Take 250 mg by mouth 3 (three) times daily.     gabapentin 400 MG capsule  Commonly known as:  NEURONTIN  Take 400 mg by mouth 3 (three) times daily.     HYDROcodone-acetaminophen 5-325 MG per tablet  Commonly known as:  NORCO/VICODIN  Take 2 tablets by mouth every 4 (four) hours as needed for pain.     HYDROcodone-acetaminophen 5-325 MG per tablet  Commonly known as:  NORCO/VICODIN  Take 1-2 tablets by mouth every 4 (four) hours as needed for pain.     hydrOXYzine 50 MG tablet  Commonly known as:  ATARAX/VISTARIL  Take 50 mg by mouth QID.     lisinopril 20 MG tablet  Commonly known as:  PRINIVIL,ZESTRIL  Take 20 mg by mouth daily.     mirtazapine 30 MG tablet  Commonly known as:  REMERON  Take 30 mg by mouth at bedtime.     omeprazole 20 MG capsule  Commonly known as:  PRILOSEC  Take 20 mg by mouth daily.     pravastatin 20 MG tablet  Commonly known as:  PRAVACHOL  Take 20 mg by mouth daily.         Velora Heckler, MD, [facility] Office: [phone]   Signed: Velora Heckler 05/13/2012, 9:27 AM

## 2012-05-13 NOTE — Op Note (Signed)
Godson Pollan [id number]   PRE-OPERATIVE DIAGNOSIS:  acute appendicitis  POST-OPERATIVE DIAGNOSIS:  acute appendicitis  PROCEDURE:  Procedure(s): APPENDECTOMY LAPAROSCOPIC  SURGEON:  Surgeon(s): Romie Levee, MD  ASSISTANT: none   ANESTHESIA:   local and general  EBL:   min  Delay start of Pharmacological VTE agent (>24hrs) due to surgical blood loss or risk of bleeding:  no  DRAINS: none   SPECIMEN:  Source of Specimen:  appendix  DISPOSITION OF SPECIMEN:  PATHOLOGY  COUNTS:  YES  PLAN OF CARE: Admit for overnight observation  PATIENT DISPOSITION:  PACU - hemodynamically stable.   INDICATIONS: Patient with concerning symptoms & work up suspicious for appendicitis.  Surgery was recommended:  The anatomy & physiology of the digestive tract was discussed.  The pathophysiology of appendicitis was discussed.  Natural history risks without surgery was discussed.   I feel the risks of no intervention will lead to serious problems that outweigh the operative risks; therefore, I recommended diagnostic laparoscopy with removal of appendix to remove the pathology.  Laparoscopic & open techniques were discussed.   I noted a good likelihood this will help address the problem.    Risks such as bleeding, infection, abscess, leak, reoperation, possible ostomy, hernia, heart attack, death, and other risks were discussed.  Goals of post-operative recovery were discussed as well.  We will work to minimize complications.  Questions were answered.  The patient expresses understanding & wishes to proceed with surgery.  OR FINDINGS: acute appendicitis at tip of appendix  DESCRIPTION:   The patient was identified & brought into the operating room. The patient was positioned supine with left arm tucked. SCDs were active during the entire case. The patient underwent general anesthesia without any difficulty.  A foley catheter was inserted under sterile conditions. The abdomen was prepped and  draped in a sterile fashion. A Surgical Timeout confirmed our plan.   I made a transverse incision through the inferior umbilical fold.  I made a nick in the infraumbilical fascia and confirmed peritoneal entry.  I placed a stay suture and then the [facility] port.  We induced carbon dioxide insufflation.  Camera inspection revealed no injury.  I placed additional ports under direct laparoscopic visualization.  I did not need to mobilized cecum as the appendix was anterior and easily visualized.  I took care to avoid injuring any retroperitoneal structures.   I freed the appendix off its attachments to the ascending colon and cecal mesentery.  I elevated the appendix. I skeletonized & ligated the mesoappendix. I was able to free off the base of the appendix which was still viable.  I stapled the appendix off the cecum using a laparoscopic stapler. I took a healthy cuff viable cecum.  I placed the appendix inside an EndoCatch bag and removed out the [place] port.  I did copious irrigation. Hemostasis was good in the mesoappendix, colon mesentery, and retroperitoneum. Staple line was intact on the cecum with no bleeding. Hemostasis was good. There was no perforation or injury.  Because the area cleaned up well after irrigation, I did not place a drain.  I aspirated the carbon dioxide. I removed the ports. I closed the umbilical fascia site using a 0 Vicryl stitch. I closed skin using 4-0 vicryl stitch.  Sterile dressings were applied.  Patient was extubated and sent to the recovery room.  I discussed the operative findings with the patient's family. Questions answered. They expressed understanding and appreciation.

## 2012-05-13 NOTE — Progress Notes (Signed)
Utilization Review completed.Tab Rylee RN,BSN NCM [phone]. 

## 2012-05-13 NOTE — Anesthesia Postprocedure Evaluation (Signed)
  Anesthesia Post-op Note  Patient: Miguel Aguilar  Procedure(s) Performed: Procedure(s): APPENDECTOMY LAPAROSCOPIC (Right)  Patient Location: PACU  Anesthesia Type:General  Level of Consciousness: awake, alert , oriented and patient cooperative  Airway and Oxygen Therapy: Patient Spontanous Breathing and Patient connected to face mask oxygen  Post-op Pain: 0 /10  Post-op Assessment: Post-op Vital signs reviewed and Patient's Cardiovascular Status Stable  Post-op Vital Signs: Reviewed and stable  Complications: No apparent anesthesia complications

## 2012-05-13 NOTE — Progress Notes (Signed)
Assessment unchanged. Pt verbalized understanding of dc instructions as well as My Chart introduction. Understands when to follow up with MD. Script x 1 given as provided by MD. Discharged via wheelchair to front entrance to meet awaiting vehicle to carry home. Accompanied by mother and NT.

## 2012-05-13 NOTE — Transfer of Care (Signed)
Immediate Anesthesia Transfer of Care Note  Patient: Miguel Aguilar  Procedure(s) Performed: Procedure(s): APPENDECTOMY LAPAROSCOPIC (Right)  Patient Location: PACU  Anesthesia Type:General  Level of Consciousness: awake, alert , sedated and patient cooperative  Airway & Oxygen Therapy: Patient Spontanous Breathing and Patient connected to face mask oxygen  Post-op Assessment: Report given to PACU RN and Post -op Vital signs reviewed and stable  Post vital signs: Reviewed and stable  Complications: No apparent anesthesia complications

## 2012-05-15 ENCOUNTER — Telehealth (INDEPENDENT_AMBULATORY_CARE_PROVIDER_SITE_OTHER): Payer: Self-pay | Admitting: *Deleted

## 2012-05-15 ENCOUNTER — Encounter (HOSPITAL_COMMUNITY): Payer: Self-pay | Admitting: General Surgery

## 2012-05-15 NOTE — Telephone Encounter (Signed)
Left message for patient to call back regarding his PO appt that was scheduled for 05/30/12 at 11a with [facility] per Gerkin MD

## 2012-05-15 NOTE — Telephone Encounter (Signed)
Patient called back and was given appt time.  Patient agreeable at this time.

## 2012-05-23 ENCOUNTER — Telehealth (INDEPENDENT_AMBULATORY_CARE_PROVIDER_SITE_OTHER): Payer: Self-pay

## 2012-05-23 NOTE — Telephone Encounter (Signed)
Pt requested protocol pain medicine refill.   Norco 5/325 #30 called to Raynald Kemp, Alex.

## 2012-05-30 ENCOUNTER — Ambulatory Visit (INDEPENDENT_AMBULATORY_CARE_PROVIDER_SITE_OTHER): Payer: Medicare Other | Admitting: Internal Medicine

## 2012-05-30 ENCOUNTER — Encounter (INDEPENDENT_AMBULATORY_CARE_PROVIDER_SITE_OTHER): Payer: Self-pay | Admitting: Internal Medicine

## 2012-05-30 DIAGNOSIS — K4020 Bilateral inguinal hernia, without obstruction or gangrene, not specified as recurrent: Secondary | ICD-10-CM

## 2012-05-30 DIAGNOSIS — K3580 Unspecified acute appendicitis: Secondary | ICD-10-CM

## 2012-05-30 DIAGNOSIS — K432 Incisional hernia without obstruction or gangrene: Secondary | ICD-10-CM

## 2012-05-30 MED ORDER — HYDROCODONE-ACETAMINOPHEN 5-325 MG PO TABS: 2.0000 | ORAL_TABLET | Freq: Four times a day (QID) | ORAL | Status: DC | PRN

## 2012-05-30 NOTE — Patient Instructions (Signed)
May resume regular activity without restrictions. Follow up as needed. Call with questions or concerns.  

## 2012-05-30 NOTE — Progress Notes (Signed)
  Subjective: Pt returns to the clinic today after undergoing laparoscopic appendectomy on 05/13/12.  The patient is tolerating their diet well and is having no severe pain.  Bowel function is good.  No problems with the wounds.  He does complain of problems with bilateral inguinal hernia that was repaired in 2005 in [place].  It does cause pain with lifting and bulging, denies obstruction.  He reports having mesh placed and that it took 6 surgeries to repair due to recurrence.    Objective: Vital signs in last 24 hours: Reviewed  PE: Abd: soft, non-tender, +bs, incisions well healed Ext: slight bulge in right inguinal that is worse with valsalva  Lab Results:  No results found for this basename: WBC, HGB, HCT, PLT,  in the last 72 hours BMET No results found for this basename: NA, K, CL, CO2, GLUCOSE, BUN, CREATININE, CALCIUM,  in the last 72 hours PT/INR No results found for this basename: LABPROT, INR,  in the last 72 hours CMP     Component Value Date/Time   NA 135 05/13/2012 0615   K 3.9 05/13/2012 0615   CL 97 05/13/2012 0615   CO2 27 05/13/2012 0615   GLUCOSE 156* 05/13/2012 0615   BUN 16 05/13/2012 0615   CREATININE 1.20 05/13/2012 0615   CALCIUM 10.0 05/13/2012 0615   GFRNONAA 72* 05/13/2012 0615   GFRAA 83* 05/13/2012 0615   Lipase  No results found for this basename: lipase       Studies/Results: No results found.  Anti-infectives: Anti-infectives   None       Assessment/Plan  1.  S/P Laparoscopic Appendectomy: doing well, may resume regular activity without restrictions, Pt will follow up with [place] PRN and knows to call with questions or concerns.    2.  Bilateral inguinal hernias without obstruction but with pain with lifting.  Will refer to Dr. Maisie Fus for further evaluation.     WHITE, ELIZABETH 05/30/2012

## 2012-06-23 ENCOUNTER — Encounter (INDEPENDENT_AMBULATORY_CARE_PROVIDER_SITE_OTHER): Payer: Self-pay | Admitting: General Surgery

## 2012-06-23 ENCOUNTER — Ambulatory Visit (INDEPENDENT_AMBULATORY_CARE_PROVIDER_SITE_OTHER): Payer: Medicare Other | Admitting: General Surgery

## 2012-06-23 VITALS — BP 140/60 | HR 86 | Temp 98.2°F | Resp 18 | Ht 77.0 in | Wt 232.0 lb

## 2012-06-23 DIAGNOSIS — K4091 Unilateral inguinal hernia, without obstruction or gangrene, recurrent: Secondary | ICD-10-CM

## 2012-06-23 NOTE — Progress Notes (Signed)
Patient ID: Miguel Aguilar, male   DOB: 11/11/1965, 47 y.o.   MRN: [id number]  Chief Complaint  Patient presents with  . New Evaluation    eval bilateral hernia    HPI Miguel Aguilar is a 47 y.o. male.  The patient is a 47 year old male who was previously seen and treated by Dr. Maisie Fus for acute appendicitis. The patient was referred for evaluation of a hernias. The patient had a previous history of 5 repairs to the left side that were all opened as well as 2 repairs to the right side which were opened as well. The patient presents today with chief complaint of bulge in the right area as well as pain to his left inguinal site. The left inguinal area is chronic. he has troublewith exercising secondary to pain on the left side.  HPI  Past Medical History  Diagnosis Date  . Bipolar 1 disorder   . ADHD (attention deficit hyperactivity disorder)   . Hyperlipemia   . GERD (gastroesophageal reflux disease)   . Hypertension   . Head trauma approx 1982    Past Surgical History  Procedure Laterality Date  . Hernia repair      total of 12 surgeries  . Laparoscopic appendectomy Right 05/12/2012    Procedure: APPENDECTOMY LAPAROSCOPIC;  Surgeon: Romie Levee, MD;  Location: [facility];  Service: General;  Laterality: Right;    Family History  Problem Relation Age of Onset  . Adopted: Yes    Social History History  Substance Use Topics  . Smoking status: Current Every Day Smoker -- 1.00 packs/day for 36 years    Types: Cigarettes  . Smokeless tobacco: Former Neurosurgeon    Types: Chew    Quit date: 05/12/1993  . Alcohol Use: No    Allergies  Allergen Reactions  . Aspirin Anaphylaxis  . Naproxen Anaphylaxis  . Ibuprofen     Current Outpatient Prescriptions  Medication Sig Dispense Refill  . amLODipine (NORVASC) 2.5 MG tablet Take 2.5 mg by mouth daily.      Marland Kitchen amoxicillin (AMOXIL) 500 MG capsule       . divalproex (DEPAKOTE) 250 MG DR tablet Take 250 mg by mouth 3 (three) times daily.       Marland Kitchen EPIPEN 2-PAK 0.3 MG/0.3ML SOAJ       . gabapentin (NEURONTIN) 400 MG capsule Take 400 mg by mouth 3 (three) times daily.      . hydrOXYzine (VISTARIL) 50 MG capsule       . mirtazapine (REMERON) 30 MG tablet Take 30 mg by mouth at bedtime.      Marland Kitchen omeprazole (PRILOSEC) 20 MG capsule Take 20 mg by mouth daily.      Marland Kitchen PATADAY 0.2 % SOLN       . pravastatin (PRAVACHOL) 20 MG tablet Take 20 mg by mouth daily.      . traZODone (DESYREL) 50 MG tablet        No current facility-administered medications for this visit.    Review of Systems Review of Systems  Constitutional: Negative.   HENT: Negative.   Respiratory: Negative.   Cardiovascular: Negative.   Neurological: Negative.     Blood pressure 140/60, pulse 86, temperature 98.2 F (36.8 C), temperature source Temporal, resp. rate 18, height 6\' 5"  (1.956 m), weight 232 lb (105.235 kg).  Physical Exam Physical Exam  Constitutional: He is oriented to person, place, and time. He appears well-developed and well-nourished.  HENT:  Head: Normocephalic and atraumatic.  Eyes: Conjunctivae and EOM  are normal. Pupils are equal, round, and reactive to light.  Neck: Normal range of motion. Neck supple.  Cardiovascular: Normal rate, regular rhythm and normal heart sounds.   Pulmonary/Chest: Effort normal and breath sounds normal.  Abdominal: Soft. Bowel sounds are normal. A hernia is present. Hernia confirmed positive in the right inguinal area. Hernia confirmed negative in the left inguinal area.  Musculoskeletal: Normal range of motion.  Neurological: He is alert and oriented to person, place, and time.    Data Reviewed CT scan reveals a fat-containing hernia the right inguinal area. There is no hernia to the left inguinal area.  Assessment    47 year old male with a recurrent right inguinal hernia, and left neuropathic pain in his left inguinal area.     Plan    1. We'll proceed to the operating room for laparoscopic right angle  hernia repair with mesh.  2.  All risks and benefits were discussed with the patient, to generally include infection, bleeding, damage to surrounding structures, acute and chronic nerve pain, and recurrence. Alternatives were offered and described.  All questions were answered and the patient voiced understanding of the procedure and wishes to proceed at this point.        Marigene Ehlers., Shakiyah Cirilo 06/23/2012, 2:42 PM

## 2012-08-28 ENCOUNTER — Encounter (HOSPITAL_COMMUNITY): Payer: Self-pay | Admitting: Pharmacy Technician

## 2012-08-29 ENCOUNTER — Telehealth (INDEPENDENT_AMBULATORY_CARE_PROVIDER_SITE_OTHER): Payer: Self-pay

## 2012-08-29 ENCOUNTER — Ambulatory Visit (HOSPITAL_COMMUNITY)
Admission: RE | Admit: 2012-08-29 | Discharge: 2012-08-29 | Disposition: A | Discharge status: Home / Self Care | Payer: Medicare Other | Source: Ambulatory Visit | Attending: General Surgery | Admitting: General Surgery

## 2012-08-29 ENCOUNTER — Encounter (HOSPITAL_COMMUNITY)
Admission: RE | Admit: 2012-08-29 | Discharge: 2012-08-29 | Disposition: A | Discharge status: Home / Self Care | Payer: Medicare Other | Source: Ambulatory Visit | Attending: General Surgery | Admitting: General Surgery

## 2012-08-29 ENCOUNTER — Encounter (HOSPITAL_COMMUNITY): Payer: Self-pay

## 2012-08-29 DIAGNOSIS — K4090 Unilateral inguinal hernia, without obstruction or gangrene, not specified as recurrent: Secondary | ICD-10-CM | POA: Insufficient documentation

## 2012-08-29 DIAGNOSIS — F17200 Nicotine dependence, unspecified, uncomplicated: Secondary | ICD-10-CM | POA: Insufficient documentation

## 2012-08-29 DIAGNOSIS — Z01812 Encounter for preprocedural laboratory examination: Secondary | ICD-10-CM | POA: Insufficient documentation

## 2012-08-29 DIAGNOSIS — I10 Essential (primary) hypertension: Secondary | ICD-10-CM | POA: Insufficient documentation

## 2012-08-29 DIAGNOSIS — Z01818 Encounter for other preprocedural examination: Secondary | ICD-10-CM | POA: Insufficient documentation

## 2012-08-29 HISTORY — DX: Unspecified abdominal hernia without obstruction or gangrene: K46.9

## 2012-08-29 HISTORY — DX: Major depressive disorder, single episode, unspecified: F32.9

## 2012-08-29 HISTORY — DX: Personal history of Methicillin resistant Staphylococcus aureus infection: Z86.14

## 2012-08-29 HISTORY — DX: Personal history of sudden cardiac arrest: Z86.74

## 2012-08-29 HISTORY — DX: Pneumonia, unspecified organism: J18.9

## 2012-08-29 HISTORY — DX: Headache: R51

## 2012-08-29 HISTORY — DX: Anxiety disorder, unspecified: F41.9

## 2012-08-29 HISTORY — DX: Depression, unspecified: F32.A

## 2012-08-29 HISTORY — DX: Unspecified convulsions: R56.9

## 2012-08-29 HISTORY — DX: Angina pectoris, unspecified: I20.9

## 2012-08-29 HISTORY — DX: Unspecified osteoarthritis, unspecified site: M19.90

## 2012-08-29 HISTORY — DX: Scoliosis, unspecified: M41.9

## 2012-08-29 HISTORY — DX: Unspecified hearing loss, left ear: H91.92

## 2012-08-29 LAB — BASIC METABOLIC PANEL
BUN: 11 mg/dL (ref 6–23)
Creatinine, Ser: 1.16 mg/dL (ref 0.50–1.35)
GFR calc Af Amer: 86 mL/min — ABNORMAL LOW (ref >90)
GFR calc non Af Amer: 75 mL/min — ABNORMAL LOW (ref >90)
Glucose, Bld: 94 mg/dL (ref 70–99)
Potassium: 4.2 mEq/L (ref 3.5–5.1)

## 2012-08-29 LAB — CBC
HCT: 44 % (ref 39.0–52.0)
Hemoglobin: 14.9 g/dL (ref 13.0–17.0)
MCHC: 33.9 g/dL (ref 30.0–36.0)
MCV: 90.7 fL (ref 78.0–100.0)
RDW: 14.3 % (ref 11.5–15.5)

## 2012-08-29 NOTE — Progress Notes (Signed)
LOV note 04/18/12 Dr. Jobe Marker on chart, note from 12/07/11 and 11/02/11 on chart, EEG 11/08/11 on chart

## 2012-08-29 NOTE — Telephone Encounter (Signed)
Miguel Aguilar at [facility] called stating Miguel Aguilar with Dr Derrell Lolling requested to be called as soon as CXR report avail. CXR report in epic. I advise Miguel Aguilar I will send msg to Miguel Aguilar and Dr Derrell Lolling letting them note report ready to be reviewed.

## 2012-08-29 NOTE — Patient Instructions (Signed)
20 Miguel Aguilar  08/29/2012   Your procedure is scheduled on: 09/05/12  Report to [facility] at 7:30AM.  Call this number if you have problems the morning of surgery [phone]   Remember:   Do not eat food or drink liquids After Midnight.     Take these medicines the morning of surgery with A SIP OF WATER: depakote, gabapentin, vistaril, omeprazole   Do not wear jewelry, make-up or nail polish.  Do not wear lotions, powders, or perfumes. You may wear deodorant.  Do not shave 48 hours prior to surgery. Men may shave face and neck.  Do not bring valuables to the hospital.  Contacts, dentures or bridgework may not be worn into surgery.     Patients discharged the day of surgery will not be allowed to drive home.  Name and phone number of your driver: Ferdinand Lango [phone]    Please read over the following fact sheets that you were given: MRSA Information.  Birdie Sons, RN  pre op nurse call if needed [phone]    FAILURE TO FOLLOW THESE INSTRUCTIONS MAY RESULT IN CANCELLATION OF YOUR SURGERY   Patient Signature: ___________________________________________

## 2012-09-05 ENCOUNTER — Encounter (HOSPITAL_COMMUNITY): Payer: Self-pay | Admitting: *Deleted

## 2012-09-05 ENCOUNTER — Encounter (HOSPITAL_COMMUNITY)
Admission: RE | Disposition: A | Discharge status: Home / Self Care | Payer: Self-pay | Source: Ambulatory Visit | Attending: General Surgery

## 2012-09-05 ENCOUNTER — Ambulatory Visit (HOSPITAL_COMMUNITY): Payer: Medicare Other | Admitting: Anesthesiology

## 2012-09-05 ENCOUNTER — Ambulatory Visit (HOSPITAL_COMMUNITY)
Admission: RE | Admit: 2012-09-05 | Discharge: 2012-09-05 | Disposition: A | Discharge status: Home / Self Care | Payer: Medicare Other | Source: Ambulatory Visit | Attending: General Surgery | Admitting: General Surgery

## 2012-09-05 ENCOUNTER — Encounter (HOSPITAL_COMMUNITY): Payer: Self-pay | Admitting: Anesthesiology

## 2012-09-05 DIAGNOSIS — K4091 Unilateral inguinal hernia, without obstruction or gangrene, recurrent: Secondary | ICD-10-CM

## 2012-09-05 DIAGNOSIS — E785 Hyperlipidemia, unspecified: Secondary | ICD-10-CM | POA: Insufficient documentation

## 2012-09-05 DIAGNOSIS — Z79899 Other long term (current) drug therapy: Secondary | ICD-10-CM | POA: Insufficient documentation

## 2012-09-05 DIAGNOSIS — I10 Essential (primary) hypertension: Secondary | ICD-10-CM | POA: Insufficient documentation

## 2012-09-05 DIAGNOSIS — K219 Gastro-esophageal reflux disease without esophagitis: Secondary | ICD-10-CM | POA: Insufficient documentation

## 2012-09-05 HISTORY — PX: INSERTION OF MESH: SHX5868

## 2012-09-05 HISTORY — PX: INGUINAL HERNIA REPAIR: SHX194

## 2012-09-05 SURGERY — REPAIR, HERNIA, INGUINAL, LAPAROSCOPIC
Anesthesia: General | Site: Groin | Laterality: Right | Wound class: Clean

## 2012-09-05 MED ORDER — BUPIVACAINE-EPINEPHRINE 0.25% -1:200000 IJ SOLN
INTRAMUSCULAR | Status: DC | PRN
  Administered 2012-09-05: 7 mL

## 2012-09-05 MED ORDER — PROMETHAZINE HCL 25 MG/ML IJ SOLN: 6.2500 mg | INTRAMUSCULAR | Status: DC | PRN

## 2012-09-05 MED ORDER — ONDANSETRON HCL 4 MG/2ML IJ SOLN
INTRAMUSCULAR | Status: DC | PRN
  Administered 2012-09-05: 4 mg via INTRAVENOUS

## 2012-09-05 MED ORDER — ROCURONIUM BROMIDE 100 MG/10ML IV SOLN
INTRAVENOUS | Status: DC | PRN
  Administered 2012-09-05: 35 mg via INTRAVENOUS
  Administered 2012-09-05: 5 mg via INTRAVENOUS

## 2012-09-05 MED ORDER — SUCCINYLCHOLINE CHLORIDE 20 MG/ML IJ SOLN
INTRAMUSCULAR | Status: DC | PRN
  Administered 2012-09-05: 100 mg via INTRAVENOUS

## 2012-09-05 MED ORDER — HYDROMORPHONE HCL PF 1 MG/ML IJ SOLN
INTRAMUSCULAR | Status: AC
  Filled 2012-09-05: qty 1

## 2012-09-05 MED ORDER — HYDROMORPHONE HCL PF 1 MG/ML IJ SOLN
0.2500 mg | INTRAMUSCULAR | Status: DC | PRN
  Administered 2012-09-05 (×3): 0.5 mg via INTRAVENOUS

## 2012-09-05 MED ORDER — OXYCODONE-ACETAMINOPHEN 10-325 MG PO TABS: 1.0000 | ORAL_TABLET | ORAL | Status: DC | PRN

## 2012-09-05 MED ORDER — PROPOFOL 10 MG/ML IV BOLUS
INTRAVENOUS | Status: DC | PRN
  Administered 2012-09-05: 200 mg via INTRAVENOUS

## 2012-09-05 MED ORDER — LACTATED RINGERS IV SOLN: INTRAVENOUS | Status: DC

## 2012-09-05 MED ORDER — MIDAZOLAM HCL 5 MG/5ML IJ SOLN
INTRAMUSCULAR | Status: DC | PRN
  Administered 2012-09-05: 2 mg via INTRAVENOUS

## 2012-09-05 MED ORDER — CEFAZOLIN SODIUM-DEXTROSE 2-3 GM-% IV SOLR: 2.0000 g | INTRAVENOUS | Status: DC

## 2012-09-05 MED ORDER — CEFAZOLIN SODIUM-DEXTROSE 2-3 GM-% IV SOLR
INTRAVENOUS | Status: AC
  Filled 2012-09-05: qty 50

## 2012-09-05 MED ORDER — BUPIVACAINE HCL (PF) 0.25 % IJ SOLN
INTRAMUSCULAR | Status: AC
  Filled 2012-09-05: qty 30

## 2012-09-05 MED ORDER — NEOSTIGMINE METHYLSULFATE 1 MG/ML IJ SOLN
INTRAMUSCULAR | Status: DC | PRN
  Administered 2012-09-05: 4 mg via INTRAVENOUS

## 2012-09-05 MED ORDER — LIDOCAINE HCL (CARDIAC) 20 MG/ML IV SOLN
INTRAVENOUS | Status: DC | PRN
  Administered 2012-09-05: 100 mg via INTRAVENOUS

## 2012-09-05 MED ORDER — MIDAZOLAM HCL 2 MG/2ML IJ SOLN
INTRAMUSCULAR | Status: AC
  Filled 2012-09-05: qty 2

## 2012-09-05 MED ORDER — LACTATED RINGERS IV SOLN
INTRAVENOUS | Status: DC
  Administered 2012-09-05 (×2): 1000 mL via INTRAVENOUS

## 2012-09-05 MED ORDER — GLYCOPYRROLATE 0.2 MG/ML IJ SOLN
INTRAMUSCULAR | Status: DC | PRN
  Administered 2012-09-05: 0.6 mg via INTRAVENOUS

## 2012-09-05 MED ORDER — FENTANYL CITRATE 0.05 MG/ML IJ SOLN
INTRAMUSCULAR | Status: DC | PRN
  Administered 2012-09-05: 50 ug via INTRAVENOUS
  Administered 2012-09-05: 100 ug via INTRAVENOUS
  Administered 2012-09-05 (×2): 50 ug via INTRAVENOUS

## 2012-09-05 SURGICAL SUPPLY — 34 items
APPLIER CLIP 5 13 M/L LIGAMAX5 (MISCELLANEOUS)
BENZOIN TINCTURE PRP APPL 2/3 (GAUZE/BANDAGES/DRESSINGS) ×2 IMPLANT
CLIP APPLIE 5 13 M/L LIGAMAX5 (MISCELLANEOUS) IMPLANT
DECANTER SPIKE VIAL GLASS SM (MISCELLANEOUS) IMPLANT
DRAPE LAPAROSCOPIC ABDOMINAL (DRAPES) ×2 IMPLANT
DRAPE UTILITY XL STRL (DRAPES) ×2 IMPLANT
ELECT REM PT RETURN 9FT ADLT (ELECTROSURGICAL) ×2
ELECTRODE REM PT RTRN 9FT ADLT (ELECTROSURGICAL) ×1 IMPLANT
GLOVE BIO SURGEON STRL SZ 6.5 (GLOVE) ×2 IMPLANT
GLOVE BIO SURGEON STRL SZ7.5 (GLOVE) ×2 IMPLANT
GLOVE BIOGEL PI IND STRL 6.5 (GLOVE) ×1 IMPLANT
GLOVE BIOGEL PI IND STRL 7.0 (GLOVE) ×1 IMPLANT
GLOVE BIOGEL PI INDICATOR 6.5 (GLOVE) ×1
GLOVE BIOGEL PI INDICATOR 7.0 (GLOVE) ×1
GOWN STRL NON-REIN LRG LVL3 (GOWN DISPOSABLE) ×2 IMPLANT
GOWN STRL REIN XL XLG (GOWN DISPOSABLE) ×4 IMPLANT
KIT BASIN OR (CUSTOM PROCEDURE TRAY) ×2 IMPLANT
MESH 3DMAX LIGHT 4.1X6.2 RT LR (Mesh General) ×2 IMPLANT
NEEDLE INSUFFLATION 14GA 120MM (NEEDLE) IMPLANT
RELOAD STAPLE HERNIA 4.0 BLUE (INSTRUMENTS) IMPLANT
SET IRRIG TUBING LAPAROSCOPIC (IRRIGATION / IRRIGATOR) IMPLANT
SOLUTION ANTI FOG 6CC (MISCELLANEOUS) ×2 IMPLANT
STAPLER HERNIA 12 8.5 360D (INSTRUMENTS) ×4 IMPLANT
STRIP CLOSURE SKIN 1/2X4 (GAUZE/BANDAGES/DRESSINGS) ×2 IMPLANT
SUT MNCRL AB 4-0 PS2 18 (SUTURE) ×4 IMPLANT
SYRINGE IRR TOOMEY STRL 70CC (SYRINGE) ×2 IMPLANT
TOWEL OR 17X26 10 PK STRL BLUE (TOWEL DISPOSABLE) ×2 IMPLANT
TRAY FOLEY CATH 14FRSI W/METER (CATHETERS) ×2 IMPLANT
TRAY LAP CHOLE (CUSTOM PROCEDURE TRAY) ×2 IMPLANT
TROCAR BLADELESS OPT 5 75 (ENDOMECHANICALS) ×2 IMPLANT
TROCAR CANNULA W/PORT DUAL 5MM (MISCELLANEOUS) IMPLANT
TROCAR SLEEVE XCEL 5X75 (ENDOMECHANICALS) ×2 IMPLANT
TROCAR XCEL 12X100 BLDLESS (ENDOMECHANICALS) ×2 IMPLANT
TUBING INSUFFLATION 10FT LAP (TUBING) ×2 IMPLANT

## 2012-09-05 NOTE — Anesthesia Preprocedure Evaluation (Addendum)
Anesthesia Evaluation  Patient identified by MRN, date of birth, ID band Patient awake    Reviewed: Allergy & Precautions, H&P , NPO status , Patient's Chart, lab work & pertinent test results  Airway Mallampati: II TM Distance: >3 FB Neck ROM: Full    Dental  (+) Poor Dentition, Loose, Missing, Partial Upper and Partial Lower,    Pulmonary pneumonia -, Current Smoker,  breath sounds clear to auscultation  Pulmonary exam normal       Cardiovascular hypertension, Pt. on medications + angina Rhythm:Regular Rate:Normal     Neuro/Psych  Headaches, Seizures -, Well Controlled,  Anxiety Depression negative psych ROS   GI/Hepatic Neg liver ROS, GERD-  ,  Endo/Other  negative endocrine ROS  Renal/GU negative Renal ROS  negative genitourinary   Musculoskeletal negative musculoskeletal ROS (+)   Abdominal   Peds  Hematology negative hematology ROS (+)   Anesthesia Other Findings   Reproductive/Obstetrics                          Anesthesia Physical Anesthesia Plan  ASA: II  Anesthesia Plan: General   Post-op Pain Management:    Induction: Intravenous  Airway Management Planned: Oral ETT  Additional Equipment:   Intra-op Plan:   Post-operative Plan: Extubation in OR  Informed Consent: I have reviewed the patients History and Physical, chart, labs and discussed the procedure including the risks, benefits and alternatives for the proposed anesthesia with the patient or authorized representative who has indicated his/her understanding and acceptance.   Dental advisory given  Plan Discussed with: CRNA  Anesthesia Plan Comments:         Anesthesia Quick Evaluation

## 2012-09-05 NOTE — Op Note (Signed)
Pre Operative Diagnosis:  Recurrent RIH   Post Operative Diagnosis: same  Procedure: Lap RIHR with mesh  Surgeon: Dr. Axel Filler  Assistant: none  Anesthesia: GETA  EBL: 5cc  Complications: none  Counts: reported as correct x 2  Findings:  Small right indirect hernia containing preperitoneal adipose, there was no direct hernia  Indications for procedure:  Pt is a 47 y/o M with a previous h/o RIHR x2, both open.  Pt was c/o a bulge and pain and seen in clinic and decided to have his hernia repaired.  Details of the procedure: Details of the procedure: The patient was taken back to the operating room. The patient was placed in supine position with bilateral SCDs in place. After appropriate anitbiotics were confirmed, a time-out was confirmed and all facts were verified. 0.25% Marcaine was used to infiltrate the umbilical area. He was used to cut down the skin and blunt dissection was used to get the anterior fashion.  The anterior fascia was incised approximately 1 cm and the muscles were divided anteriorly. Blunt dissection was then used to create a space in the preperitoneal area. At this time a 10 mm camera was then introduced into the space and advanced the pubic tubercle and a 12 mm trocar was placed over this and insufflation was started.  At this time and space was created from medial to laterally in the preperitoneal space. There was a moderate amount of preperitoneal fat that was seen herniating into the indirect space. This was easily dissected back.  Dissection of the spermatic cord was undertaken and the vas deferens was identified and protected in all parts of the case.  II dissected the direct hernia space and there was none. Once the preperitoneal fat was reduced and the cord was appropriately dissected a piece of Bard 3D MAx light mesh was place into the preperitoneal space.  The mesh was brought over the hernia space defect and anchored into place and secured to Cooper's  ligament with 4.59mm staples from a [place] hernia stapler. It was anchored to the anterior abdominal wall with 4.8 mm staples. The hernia sac was seen lying anterior to the mesh. There was no staples placed laterally. The insufflation was evacuated. The trochars were removed. The anterior fascia was reapproximated using #1 Vicryl on a UR- 6.   The skin was reapproximated using 4-0 Monocryl subcuticular fashion the patient was awakened from general anesthesia and taken to recovery in stable condition.

## 2012-09-05 NOTE — Anesthesia Postprocedure Evaluation (Signed)
Anesthesia Post Note  Patient: Miguel Aguilar  Procedure(s) Performed: Procedure(s) (LRB): LAPAROSCOPIC INGUINAL HERNIA (Right) INSERTION OF MESH (Right)  Anesthesia type: General  Patient location: PACU  Post pain: Pain level controlled  Post assessment: Post-op Vital signs reviewed  Last Vitals:  Filed Vitals:   09/05/12 1138  BP: 147/96  Pulse: 70  Temp: 36.6 C  Resp: 12    Post vital signs: Reviewed  Level of consciousness: sedated  Complications: No apparent anesthesia complications

## 2012-09-05 NOTE — H&P (Signed)
HPI  Miguel Aguilar is a 47 y.o. male. The patient is a 47 year old male who was previously seen and treated by Dr. Maisie Fus for acute appendicitis. The patient was referred for evaluation of a hernias. The patient had a previous history of 5 repairs to the left side that were all open as well as 2 repairs to the right side which were open as well. The patient presents today with chief complaint of bulge in the right area as well as pain to his left inguinal site. The left inguinal area is chronic. he has troublewith exercising secondary to pain on the left side.  HPI  Past Medical History   Diagnosis  Date   .  Bipolar 1 disorder    .  ADHD (attention deficit hyperactivity disorder)    .  Hyperlipemia    .  GERD (gastroesophageal reflux disease)    .  Hypertension    .  Head trauma  approx 1982    Past Surgical History   Procedure  Laterality  Date   .  Hernia repair       total of 12 surgeries   .  Laparoscopic appendectomy  Right  05/12/2012     Procedure: APPENDECTOMY LAPAROSCOPIC; Surgeon: Romie Levee, MD; Location: [facility]; Service: General; Laterality: Right;    Family History   Problem  Relation  Age of Onset   .  Adopted: Yes   Social History  History   Substance Use Topics   .  Smoking status:  Current Every Day Smoker -- 1.00 packs/day for 36 years     Types:  Cigarettes   .  Smokeless tobacco:  Former Neurosurgeon     Types:  Chew     Quit date:  05/12/1993   .  Alcohol Use:  No    Allergies   Allergen  Reactions   .  Aspirin  Anaphylaxis   .  Naproxen  Anaphylaxis   .  Ibuprofen     Current Outpatient Prescriptions   Medication  Sig  Dispense  Refill   .  amLODipine (NORVASC) 2.5 MG tablet  Take 2.5 mg by mouth daily.     Marland Kitchen  amoxicillin (AMOXIL) 500 MG capsule      .  divalproex (DEPAKOTE) 250 MG DR tablet  Take 250 mg by mouth 3 (three) times daily.     Marland Kitchen  EPIPEN 2-PAK 0.3 MG/0.3ML SOAJ      .  gabapentin (NEURONTIN) 400 MG capsule  Take 400 mg by mouth 3 (three) times  daily.     .  hydrOXYzine (VISTARIL) 50 MG capsule      .  mirtazapine (REMERON) 30 MG tablet  Take 30 mg by mouth at bedtime.     Marland Kitchen  omeprazole (PRILOSEC) 20 MG capsule  Take 20 mg by mouth daily.     Marland Kitchen  PATADAY 0.2 % SOLN      .  pravastatin (PRAVACHOL) 20 MG tablet  Take 20 mg by mouth daily.     .  traZODone (DESYREL) 50 MG tablet       No current facility-administered medications for this visit.   Review of Systems  Review of Systems  Constitutional: Negative.  HENT: Negative.  Respiratory: Negative.  Cardiovascular: Negative.  Neurological: Negative.  Blood pressure 140/60, pulse 86, temperature 98.2 F (36.8 C), temperature source Temporal, resp. rate 18, height 6\' 5"  (1.956 m), weight 232 lb (105.235 kg).  Physical Exam  Physical Exam  Constitutional: He is  oriented to person, place, and time. He appears well-developed and well-nourished.  HENT:  Head: Normocephalic and atraumatic.  Eyes: Conjunctivae and EOM are normal. Pupils are equal, round, and reactive to light.  Neck: Normal range of motion. Neck supple.  Cardiovascular: Normal rate, regular rhythm and normal heart sounds.  Pulmonary/Chest: Effort normal and breath sounds normal.  Abdominal: Soft. Bowel sounds are normal. A hernia is present. Hernia confirmed positive in the right inguinal area. Hernia confirmed negative in the left inguinal area.  Musculoskeletal: Normal range of motion.  Neurological: He is alert and oriented to person, place, and time.  Data Reviewed  CT scan reveals a fat-containing hernia the right inguinal area. There is no hernia to the left inguinal area.  Assessment  47 year old male with a recurrent right inguinal hernia, and left neuropathic pain in his left inguinal area.  Plan  1. We'll proceed to the operating room for laparoscopic right inguinal hernia repair with mesh.  2. All risks and benefits were discussed with the patient, to generally include infection, bleeding, damage to  surrounding structures, acute and chronic nerve pain, and recurrence. Alternatives were offered and described. All questions were answered and the patient voiced understanding of the procedure and wishes to proceed at this point.

## 2012-09-05 NOTE — Progress Notes (Signed)
Pt did not want percocet, ask for Vicodin.  I gave him Vicodin ES 7.5/300  #30 no refills. 1 po q4h prn.

## 2012-09-05 NOTE — Transfer of Care (Signed)
Immediate Anesthesia Transfer of Care Note  Patient: Miguel Aguilar  Procedure(s) Performed: Procedure(s): LAPAROSCOPIC INGUINAL HERNIA (Right) INSERTION OF MESH (Right)  Patient Location: PACU  Anesthesia Type:General  Level of Consciousness: awake, alert  and oriented  Airway & Oxygen Therapy: Patient Spontanous Breathing and Patient connected to face mask oxygen  Post-op Assessment: Report given to PACU RN and Post -op Vital signs reviewed and stable  Post vital signs: Reviewed and stable  Complications: No apparent anesthesia complications

## 2012-09-06 ENCOUNTER — Encounter (HOSPITAL_COMMUNITY): Payer: Self-pay | Admitting: General Surgery

## 2012-09-06 ENCOUNTER — Telehealth (INDEPENDENT_AMBULATORY_CARE_PROVIDER_SITE_OTHER): Payer: Self-pay | Admitting: General Surgery

## 2012-09-06 NOTE — Telephone Encounter (Signed)
Spoke with Miguel Aguilar to inform him he has a po appt w/ Dr. Derrell Lolling on 09/19/12 at 4:10 w/ arrival time of 3:55.  A appt card was also sent in the mail for this Miguel Aguilar.

## 2012-09-10 ENCOUNTER — Other Ambulatory Visit (INDEPENDENT_AMBULATORY_CARE_PROVIDER_SITE_OTHER): Payer: Self-pay | Admitting: General Surgery

## 2012-09-10 ENCOUNTER — Telehealth (INDEPENDENT_AMBULATORY_CARE_PROVIDER_SITE_OTHER): Payer: Self-pay | Admitting: General Surgery

## 2012-09-10 NOTE — Telephone Encounter (Signed)
Patient called for pain medication refill I called and hydrocodone 7.5 mg #3o [facility] [place]

## 2012-09-13 ENCOUNTER — Telehealth (INDEPENDENT_AMBULATORY_CARE_PROVIDER_SITE_OTHER): Payer: Self-pay

## 2012-09-13 NOTE — Telephone Encounter (Signed)
Patient is asking if ok to get in public pool today, I advised it is not recommended for the first 10 days. He was reminded of his appointment  09/20/12@ 4:40

## 2012-09-19 ENCOUNTER — Encounter (INDEPENDENT_AMBULATORY_CARE_PROVIDER_SITE_OTHER): Payer: Medicare Other | Admitting: General Surgery

## 2012-09-20 ENCOUNTER — Ambulatory Visit (INDEPENDENT_AMBULATORY_CARE_PROVIDER_SITE_OTHER): Payer: Medicare Other | Admitting: General Surgery

## 2012-09-20 ENCOUNTER — Encounter (INDEPENDENT_AMBULATORY_CARE_PROVIDER_SITE_OTHER): Payer: Self-pay | Admitting: General Surgery

## 2012-09-20 VITALS — BP 106/80 | HR 78 | Resp 18 | Ht 77.0 in | Wt 233.0 lb

## 2012-09-20 DIAGNOSIS — Z9889 Other specified postprocedural states: Secondary | ICD-10-CM

## 2012-09-20 DIAGNOSIS — Z8719 Personal history of other diseases of the digestive system: Secondary | ICD-10-CM

## 2012-09-20 MED ORDER — OXYCODONE-ACETAMINOPHEN 5-325 MG PO TABS: 1.0000 | ORAL_TABLET | ORAL | Status: AC | PRN

## 2012-09-20 NOTE — Addendum Note (Signed)
Addended by: June Leap on: 09/20/2012 05:24 PM   Modules accepted: Orders

## 2012-09-20 NOTE — Progress Notes (Signed)
Patient ID: Miguel Aguilar, male   DOB: 1965-05-15, 47 y.o.   MRN: [id number] The patient is a 47 year old male status post laparoscopic right inguinal hernia repair with mesh. Patient didn't do well postoperatively and has had no pain. Patient can get back to his aerobic activities with no issues.  On exam: His wounds are clean dry and intact. There is no hernia on palpation.  Assessment and plan: 47 year old male status post laparoscopic right inguinal hernia repair with mesh. 1. We'll have patient follow back up as needed  2.I will give the patient a pain medication prescription

## 2013-06-18 ENCOUNTER — Encounter (HOSPITAL_BASED_OUTPATIENT_CLINIC_OR_DEPARTMENT_OTHER): Payer: Self-pay | Admitting: Emergency Medicine

## 2013-06-18 ENCOUNTER — Emergency Department (HOSPITAL_BASED_OUTPATIENT_CLINIC_OR_DEPARTMENT_OTHER)
Admission: EM | Admit: 2013-06-18 | Discharge: 2013-06-19 | Disposition: A | Discharge status: Home / Self Care | Payer: Medicare Other | Attending: Emergency Medicine | Admitting: Emergency Medicine

## 2013-06-18 ENCOUNTER — Emergency Department (HOSPITAL_BASED_OUTPATIENT_CLINIC_OR_DEPARTMENT_OTHER): Payer: Medicare Other

## 2013-06-18 DIAGNOSIS — F909 Attention-deficit hyperactivity disorder, unspecified type: Secondary | ICD-10-CM | POA: Insufficient documentation

## 2013-06-18 DIAGNOSIS — I209 Angina pectoris, unspecified: Secondary | ICD-10-CM | POA: Insufficient documentation

## 2013-06-18 DIAGNOSIS — F411 Generalized anxiety disorder: Secondary | ICD-10-CM | POA: Insufficient documentation

## 2013-06-18 DIAGNOSIS — Z8701 Personal history of pneumonia (recurrent): Secondary | ICD-10-CM | POA: Insufficient documentation

## 2013-06-18 DIAGNOSIS — Z79899 Other long term (current) drug therapy: Secondary | ICD-10-CM | POA: Insufficient documentation

## 2013-06-18 DIAGNOSIS — F319 Bipolar disorder, unspecified: Secondary | ICD-10-CM | POA: Insufficient documentation

## 2013-06-18 DIAGNOSIS — R1032 Left lower quadrant pain: Secondary | ICD-10-CM | POA: Insufficient documentation

## 2013-06-18 DIAGNOSIS — Z87891 Personal history of nicotine dependence: Secondary | ICD-10-CM | POA: Insufficient documentation

## 2013-06-18 DIAGNOSIS — E785 Hyperlipidemia, unspecified: Secondary | ICD-10-CM | POA: Insufficient documentation

## 2013-06-18 DIAGNOSIS — Z8674 Personal history of sudden cardiac arrest: Secondary | ICD-10-CM | POA: Insufficient documentation

## 2013-06-18 DIAGNOSIS — R109 Unspecified abdominal pain: Secondary | ICD-10-CM

## 2013-06-18 DIAGNOSIS — Z8739 Personal history of other diseases of the musculoskeletal system and connective tissue: Secondary | ICD-10-CM | POA: Insufficient documentation

## 2013-06-18 DIAGNOSIS — N509 Disorder of male genital organs, unspecified: Secondary | ICD-10-CM | POA: Insufficient documentation

## 2013-06-18 DIAGNOSIS — Z8669 Personal history of other diseases of the nervous system and sense organs: Secondary | ICD-10-CM | POA: Insufficient documentation

## 2013-06-18 DIAGNOSIS — K219 Gastro-esophageal reflux disease without esophagitis: Secondary | ICD-10-CM | POA: Insufficient documentation

## 2013-06-18 DIAGNOSIS — Z87828 Personal history of other (healed) physical injury and trauma: Secondary | ICD-10-CM | POA: Insufficient documentation

## 2013-06-18 DIAGNOSIS — Z8614 Personal history of Methicillin resistant Staphylococcus aureus infection: Secondary | ICD-10-CM | POA: Insufficient documentation

## 2013-06-18 DIAGNOSIS — IMO0002 Reserved for concepts with insufficient information to code with codable children: Secondary | ICD-10-CM | POA: Insufficient documentation

## 2013-06-18 DIAGNOSIS — I10 Essential (primary) hypertension: Secondary | ICD-10-CM | POA: Insufficient documentation

## 2013-06-18 LAB — URINALYSIS, ROUTINE W REFLEX MICROSCOPIC
Bilirubin Urine: NEGATIVE
GLUCOSE, UA: NEGATIVE mg/dL
Hgb urine dipstick: NEGATIVE
KETONES UR: 15 mg/dL — AB
LEUKOCYTES UA: NEGATIVE
Nitrite: NEGATIVE
PROTEIN: NEGATIVE mg/dL
Specific Gravity, Urine: 1.026 (ref 1.005–1.030)
Urobilinogen, UA: 1 mg/dL (ref 0.0–1.0)
pH: 5.5 (ref 5.0–8.0)

## 2013-06-18 LAB — CBC WITH DIFFERENTIAL/PLATELET
BASOS ABS: 0 10*3/uL (ref 0.0–0.1)
BASOS PCT: 0 % (ref 0–1)
EOS ABS: 0.1 10*3/uL (ref 0.0–0.7)
EOS PCT: 2 % (ref 0–5)
HEMATOCRIT: 43.8 % (ref 39.0–52.0)
Hemoglobin: 15.3 g/dL (ref 13.0–17.0)
Lymphocytes Relative: 44 % (ref 12–46)
Lymphs Abs: 3.3 10*3/uL (ref 0.7–4.0)
MCH: 31.5 pg (ref 26.0–34.0)
MCHC: 34.9 g/dL (ref 30.0–36.0)
MCV: 90.1 fL (ref 78.0–100.0)
MONO ABS: 0.6 10*3/uL (ref 0.1–1.0)
Monocytes Relative: 8 % (ref 3–12)
Neutro Abs: 3.5 10*3/uL (ref 1.7–7.7)
Neutrophils Relative %: 46 % (ref 43–77)
Platelets: 269 10*3/uL (ref 150–400)
RBC: 4.86 MIL/uL (ref 4.22–5.81)
RDW: 13.9 % (ref 11.5–15.5)
WBC: 7.5 10*3/uL (ref 4.0–10.5)

## 2013-06-18 LAB — BASIC METABOLIC PANEL
BUN: 12 mg/dL (ref 6–23)
CALCIUM: 9.9 mg/dL (ref 8.4–10.5)
CO2: 26 mEq/L (ref 19–32)
CREATININE: 1.3 mg/dL (ref 0.50–1.35)
Chloride: 100 mEq/L (ref 96–112)
GFR calc non Af Amer: 64 mL/min — ABNORMAL LOW (ref >90)
GFR, EST AFRICAN AMERICAN: 75 mL/min — AB (ref >90)
Glucose, Bld: 103 mg/dL — ABNORMAL HIGH (ref 70–99)
Potassium: 3.9 mEq/L (ref 3.7–5.3)
Sodium: 141 mEq/L (ref 137–147)

## 2013-06-18 LAB — LIPASE, BLOOD: LIPASE: 38 U/L (ref 11–59)

## 2013-06-18 MED ORDER — ONDANSETRON HCL 4 MG/2ML IJ SOLN
4.0000 mg | Freq: Once | INTRAMUSCULAR | Status: AC
  Administered 2013-06-18: 4 mg via INTRAVENOUS
  Filled 2013-06-18: qty 2

## 2013-06-18 MED ORDER — IOHEXOL 300 MG/ML  SOLN
100.0000 mL | Freq: Once | INTRAMUSCULAR | Status: AC | PRN
  Administered 2013-06-18: 100 mL via INTRAVENOUS

## 2013-06-18 MED ORDER — IOHEXOL 300 MG/ML  SOLN
50.0000 mL | Freq: Once | INTRAMUSCULAR | Status: AC | PRN
  Administered 2013-06-18: 50 mL via ORAL

## 2013-06-18 MED ORDER — HYDROMORPHONE HCL PF 1 MG/ML IJ SOLN
1.0000 mg | Freq: Once | INTRAMUSCULAR | Status: AC
  Administered 2013-06-18: 1 mg via INTRAVENOUS
  Filled 2013-06-18: qty 1

## 2013-06-18 NOTE — ED Notes (Signed)
Pt reports LLQ abd pain n/v/d.  Also reports back pain d/t scoliosis.

## 2013-06-18 NOTE — ED Provider Notes (Signed)
CSN: [id number]     Arrival date & time 06/18/13  1827 History  This chart was scribed for Miguel Roof, MD by Charline Bills, ED Scribe. The patient was seen in room MH01/MH01. Patient's care was started at 10:13 PM.    Chief Complaint  Patient presents with  . Abdominal Pain    The history is provided by the patient. No language interpreter was used.   HPI Comments: Miguel Aguilar is a 48 y.o. male who presents to the Emergency Department complaining of constant LLQ pain onset 8 days ago. He currently rates his pain 8/10. He states that the abdominal pain radiates across the lower abdomen.  Pt reports associated emesis that he describes as intermittent, "fatty" and "yellow" for 48 hours that has resolved. Pt also reports associated diarrhea that has resolved, loss of appetite, diaphoresis and chills. Pt states that he has only eaten 3 times since being sick. Today he consumed Gatorade and 3-4 fries. He also reports only 2 normal stools since being sick, 1 was today in the ED. He also reports back pain that he is unsure if it is related to this. He denies fever. Pt has tried Zofran, Vicodin and Methadone with mild relief.   Pt has had multiple hernia repairs. 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    Past Medical History  Diagnosis Date  . Bipolar 1 disorder   . ADHD (attention deficit hyperactivity disorder)   . Hyperlipemia   . GERD (gastroesophageal reflux disease)   . Hypertension   . Head trauma approx 1982  . 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 Anginal pain     hx of  . Hernia   . Anxiety   . Depression   . Pneumonia     hx of  . Seizures     few months  . Headache(784.0)   . Arthritis   . Scoliosis   . Hx MRSA infection   . Deafness in left ear    Past Surgical History  Procedure Laterality Date  . Laparoscopic appendectomy Right 05/12/2012    Procedure: APPENDECTOMY LAPAROSCOPIC;  Surgeon: Romie Levee, MD;  Location:  [facility];  Service: General;  Laterality: Right;  . Hernia repair Bilateral     x10  . Inguinal hernia repair Right 09/05/2012    Procedure: LAPAROSCOPIC INGUINAL HERNIA;  Surgeon: Axel Filler, MD;  Location: [facility];  Service: General;  Laterality: Right;  . Insertion of mesh Right 09/05/2012    Procedure: INSERTION OF MESH;  Surgeon: Axel Filler, MD;  Location: [facility];  Service: General;  Laterality: Right;   Family History  Problem Relation Age of Onset  . Adopted: Yes   History  Substance Use Topics  . Smoking status: Former Smoker -- 0.25 packs/day for 36 years    Types: Cigarettes    Quit date: 09/20/2012  . Smokeless tobacco: Former Neurosurgeon    Types: Chew    Quit date: 05/12/1993  . Alcohol Use: No     Comment: occasional    Review of Systems  Constitutional: Positive for chills, diaphoresis and appetite change. Negative for fever.  Gastrointestinal: Positive for vomiting, abdominal pain and diarrhea.  Musculoskeletal: Positive for back pain.  All other systems reviewed and are negative.   Allergies  Aspirin; Ibuprofen; Naproxen; and Nsaids  Home Medications   Prior to Admission medications   Medication Sig Start Date End Date Taking? Authorizing Provider  amLODipine (NORVASC) 2.5 MG tablet Take 2.5 mg by mouth every evening.     Historical Provider, MD  chlorhexidine (PERIDEX) 0.12 % solution  07/10/12   Historical Provider, MD  divalproex (DEPAKOTE) 250 MG DR tablet Take 250 mg by mouth 3 (three) times daily.    Historical Provider, MD  EPIPEN 2-PAK 0.3 MG/0.3ML SOAJ 0.3 mg once.  03/21/12   Historical Provider, MD  gabapentin (NEURONTIN) 400 MG capsule Take 400 mg by mouth 3 (three) times daily.    Historical Provider, MD  HYDROcodone-acetaminophen [facility](NORCO) 7.5-325 MG per tablet  09/10/12   Historical Provider, MD  hydrOXYzine (VISTARIL) 50 MG capsule Take 100 mg by mouth 4 (four) times daily as needed for anxiety.  06/22/12   Historical Provider, MD  mirtazapine  (REMERON) 30 MG tablet Take 30 mg by mouth at bedtime.    Historical Provider, MD  omeprazole (PRILOSEC) 20 MG capsule Take 20 mg by mouth daily.    Historical Provider, MD  oxyCODONE-acetaminophen (ROXICET) 5-325 MG per tablet Take 1 tablet by mouth every 4 (four) hours as needed for pain. 09/20/12 09/20/13  Axel FillerArmando Ramirez, MD  PATADAY 0.2 % SOLN Place 1 drop into both eyes daily.  06/19/12   Historical Provider, MD  pravastatin (PRAVACHOL) 20 MG tablet Take 20 mg by mouth every evening.     Historical Provider, MD  traZODone (DESYREL) 50 MG tablet Take 200 mg by mouth at bedtime.  06/22/12   Historical Provider, MD  triamcinolone (KENALOG) 0.1 % paste  08/14/12   Historical Provider, MD   Triage Vitals: BP 144/81  Pulse 78  Temp(Src) 98.5 F (36.9 C) (Oral)  Resp 20  Ht 6\' 5"  (1.956 m)  Wt 243 lb (110.224 kg)  BMI 28.81 kg/m2  SpO2 100% Physical Exam  Nursing note and vitals reviewed. Constitutional: He is oriented to person, place, and time. He appears well-developed and well-nourished. No distress.  HENT:  Head: Normocephalic and atraumatic.  Mouth/Throat: Oropharynx is clear and moist.  Eyes: Conjunctivae are normal. Pupils are equal, round, and reactive to light. No scleral icterus.  Neck: Neck supple.  Cardiovascular: Normal rate, regular rhythm, normal heart sounds and intact distal pulses.   No murmur heard. Pulmonary/Chest: Effort normal and breath sounds normal. No stridor. No respiratory distress. He has no wheezes. He has no rales.  Abdominal: Soft. He exhibits no distension. There is tenderness in the left lower quadrant. There is no rigidity, no rebound and no guarding. Hernia confirmed negative in the right inguinal area and confirmed negative in the left inguinal area.  Genitourinary: Right testis shows no mass and no tenderness. Left testis shows no mass and no tenderness.  Left epididymis tenderness to palpation  Musculoskeletal: Normal range of motion. He exhibits no  edema.  Neurological: He is alert and oriented to person, place, and time.  Skin: Skin is warm and dry. No rash noted.  Psychiatric: He has a normal mood and affect. His behavior is normal.    ED Course  Procedures (including critical care time) DIAGNOSTIC STUDIES: Oxygen Saturation is 100% on RA, normal by my interpretation.    COORDINATION OF CARE: 10:30 PM Discussed treatment plan with pt at bedside and pt agreed to plan.  Labs Review Labs Reviewed  URINALYSIS, ROUTINE W REFLEX MICROSCOPIC - Abnormal; Notable for the following:    Ketones, ur 15 (*)    All other components within normal limits  BASIC METABOLIC PANEL - Abnormal; Notable for the following:  Glucose, Bld 103 (*)    GFR calc non Af Amer 64 (*)    GFR calc Af Amer 75 (*)    All other components within normal limits  CBC WITH DIFFERENTIAL  LIPASE, BLOOD    Imaging Review Ct Abdomen Pelvis W Contrast  06/18/2013   CLINICAL DATA:  Low abdominal pain  EXAM: CT ABDOMEN AND PELVIS WITH CONTRAST  TECHNIQUE: Multidetector CT imaging of the abdomen and pelvis was performed using the standard protocol following bolus administration of intravenous contrast.  CONTRAST:  50mL OMNIPAQUE IOHEXOL 300 MG/ML SOLN, 100mL OMNIPAQUE IOHEXOL 300 MG/ML SOLN  COMPARISON:  05/12/2012  FINDINGS: The lung bases are free of acute infiltrate or sizable effusion.  The liver, gallbladder, spleen, adrenal glands and pancreas are all normal in their CT appearance the kidneys are well visualized bilaterally with normal enhancement pattern. No renal calculi or obstructive changes are noted. The bladder is well distended. No pelvic mass lesion is noted. Changes consistent with prior appendectomy are noted. Mild diverticulosis is seen without evidence of diverticulitis. Scarring is noted related to previous hernia repair particularly on the left. No acute bony abnormality is noted.  IMPRESSION: No acute abnormality seen.   Electronically Signed   By: Alcide CleverMark   Lukens M.D.   On: 06/18/2013 23:56  All radiology studies independently viewed by me.      EKG Interpretation None      MDM   Final diagnoses:  Abdominal pain    48 yo male with LLQ abdominal pain for past 8 days.  Initially had vomiting for 2 days, then diarrhea for 2 days, then just abdominal pain and nausea for past 4 days.  Pt nontoxic, well appearing, abdomen soft, but tender.  No peritoneal signs.  Had some left epididymal tenderness.  Labs unremarkable.  CT abd/pel negative.  Don't suspect testicular torsion by history and exam.  However, will treat for epididymitis as possible cause of his left sided low abdominal pain.  Given return precautions.    I personally performed the services described in this documentation, which was scribed in my presence. The recorded information has been reviewed and is accurate.    Candyce ChurnJohn David Bharath Bernstein III, MD 06/19/13 [id number]

## 2013-06-19 MED ORDER — OXYCODONE-ACETAMINOPHEN 5-325 MG PO TABS: 1.0000 | ORAL_TABLET | Freq: Four times a day (QID) | ORAL | Status: DC | PRN

## 2013-06-19 MED ORDER — LEVOFLOXACIN 500 MG PO TABS: 500.0000 mg | ORAL_TABLET | Freq: Every day | ORAL | Status: AC

## 2013-06-19 MED ORDER — ONDANSETRON HCL 4 MG PO TABS: 4.0000 mg | ORAL_TABLET | Freq: Four times a day (QID) | ORAL | Status: AC

## 2013-06-19 NOTE — Discharge Instructions (Signed)
Abdominal Pain, Adult °Many things can cause abdominal pain. Usually, abdominal pain is not caused by a disease and will improve without treatment. It can often be observed and treated at home. Your health care provider will do a physical exam and possibly order blood tests and X-rays to help determine the seriousness of your pain. However, in many cases, more time must pass before a clear cause of the pain can be found. Before that point, your health care provider may not know if you need more testing or further treatment. °HOME CARE INSTRUCTIONS  °Monitor your abdominal pain for any changes. The following actions may help to alleviate any discomfort you are experiencing: °· Only take over-the-counter or prescription medicines as directed by your health care provider. °· Do not take laxatives unless directed to do so by your health care provider. °· Try a clear liquid diet (broth, tea, or water) as directed by your health care provider. Slowly move to a bland diet as tolerated. °SEEK MEDICAL CARE IF: °· You have unexplained abdominal pain. °· You have abdominal pain associated with nausea or diarrhea. °· You have pain when you urinate or have a bowel movement. °· You experience abdominal pain that wakes you in the night. °· You have abdominal pain that is worsened or improved by eating food. °· You have abdominal pain that is worsened with eating fatty foods. °SEEK IMMEDIATE MEDICAL CARE IF:  °· Your pain does not go away within 2 hours. °· You have a fever. °· You keep throwing up (vomiting). °· Your pain is felt only in portions of the abdomen, such as the right side or the left lower portion of the abdomen. °· You pass bloody or black tarry stools. °MAKE SURE YOU: °· Understand these instructions.   °· Will watch your condition.   °· Will get help right away if you are not doing well or get worse.   °Document Released: 11/04/2004 Document Revised: 11/15/2012 Document Reviewed: 10/04/2012 °[employer]® Patient  Information ©2014 [employer]. ° °

## 2013-12-20 ENCOUNTER — Emergency Department (HOSPITAL_BASED_OUTPATIENT_CLINIC_OR_DEPARTMENT_OTHER)
Admission: EM | Admit: 2013-12-20 | Discharge: 2013-12-20 | Disposition: A | Discharge status: Home / Self Care | Payer: Medicare Other | Attending: Emergency Medicine | Admitting: Emergency Medicine

## 2013-12-20 ENCOUNTER — Emergency Department (HOSPITAL_BASED_OUTPATIENT_CLINIC_OR_DEPARTMENT_OTHER): Payer: Medicare Other

## 2013-12-20 ENCOUNTER — Encounter (HOSPITAL_BASED_OUTPATIENT_CLINIC_OR_DEPARTMENT_OTHER): Payer: Self-pay | Admitting: *Deleted

## 2013-12-20 DIAGNOSIS — W108XXA Fall (on) (from) other stairs and steps, initial encounter: Secondary | ICD-10-CM | POA: Diagnosis not present

## 2013-12-20 DIAGNOSIS — S161XXA Strain of muscle, fascia and tendon at neck level, initial encounter: Secondary | ICD-10-CM | POA: Diagnosis not present

## 2013-12-20 DIAGNOSIS — S39012A Strain of muscle, fascia and tendon of lower back, initial encounter: Secondary | ICD-10-CM | POA: Diagnosis not present

## 2013-12-20 DIAGNOSIS — M419 Scoliosis, unspecified: Secondary | ICD-10-CM | POA: Diagnosis not present

## 2013-12-20 DIAGNOSIS — Y9389 Activity, other specified: Secondary | ICD-10-CM | POA: Insufficient documentation

## 2013-12-20 DIAGNOSIS — G40909 Epilepsy, unspecified, not intractable, without status epilepticus: Secondary | ICD-10-CM | POA: Diagnosis not present

## 2013-12-20 DIAGNOSIS — Z792 Long term (current) use of antibiotics: Secondary | ICD-10-CM | POA: Diagnosis not present

## 2013-12-20 DIAGNOSIS — Z79899 Other long term (current) drug therapy: Secondary | ICD-10-CM | POA: Diagnosis not present

## 2013-12-20 DIAGNOSIS — K219 Gastro-esophageal reflux disease without esophagitis: Secondary | ICD-10-CM | POA: Insufficient documentation

## 2013-12-20 DIAGNOSIS — I10 Essential (primary) hypertension: Secondary | ICD-10-CM | POA: Insufficient documentation

## 2013-12-20 DIAGNOSIS — F319 Bipolar disorder, unspecified: Secondary | ICD-10-CM | POA: Diagnosis not present

## 2013-12-20 DIAGNOSIS — E785 Hyperlipidemia, unspecified: Secondary | ICD-10-CM | POA: Diagnosis not present

## 2013-12-20 DIAGNOSIS — Z8614 Personal history of Methicillin resistant Staphylococcus aureus infection: Secondary | ICD-10-CM | POA: Insufficient documentation

## 2013-12-20 DIAGNOSIS — H9192 Unspecified hearing loss, left ear: Secondary | ICD-10-CM | POA: Insufficient documentation

## 2013-12-20 DIAGNOSIS — S29019A Strain of muscle and tendon of unspecified wall of thorax, initial encounter: Secondary | ICD-10-CM | POA: Diagnosis not present

## 2013-12-20 DIAGNOSIS — Y998 Other external cause status: Secondary | ICD-10-CM | POA: Diagnosis not present

## 2013-12-20 DIAGNOSIS — S199XXA Unspecified injury of neck, initial encounter: Secondary | ICD-10-CM | POA: Diagnosis present

## 2013-12-20 DIAGNOSIS — Y9289 Other specified places as the place of occurrence of the external cause: Secondary | ICD-10-CM | POA: Diagnosis not present

## 2013-12-20 DIAGNOSIS — Z87891 Personal history of nicotine dependence: Secondary | ICD-10-CM | POA: Diagnosis not present

## 2013-12-20 DIAGNOSIS — Z8701 Personal history of pneumonia (recurrent): Secondary | ICD-10-CM | POA: Insufficient documentation

## 2013-12-20 DIAGNOSIS — W19XXXA Unspecified fall, initial encounter: Secondary | ICD-10-CM

## 2013-12-20 DIAGNOSIS — Z8674 Personal history of sudden cardiac arrest: Secondary | ICD-10-CM | POA: Diagnosis not present

## 2013-12-20 DIAGNOSIS — Z87828 Personal history of other (healed) physical injury and trauma: Secondary | ICD-10-CM | POA: Insufficient documentation

## 2013-12-20 DIAGNOSIS — M1990 Unspecified osteoarthritis, unspecified site: Secondary | ICD-10-CM | POA: Insufficient documentation

## 2013-12-20 MED ORDER — HYDROCODONE-ACETAMINOPHEN 5-325 MG PO TABS: 1.0000 | ORAL_TABLET | ORAL | Status: DC | PRN

## 2013-12-20 MED ORDER — HYDROMORPHONE HCL 1 MG/ML IJ SOLN
1.0000 mg | Freq: Once | INTRAMUSCULAR | Status: AC
  Administered 2013-12-20: 1 mg via INTRAMUSCULAR
  Filled 2013-12-20: qty 1

## 2013-12-20 NOTE — Discharge Instructions (Signed)
Back Pain, Adult Low back pain is very common. About 1 in 5 people have back pain.The cause of low back pain is rarely dangerous. The pain often gets better over time.About half of people with a sudden onset of back pain feel better in just 2 weeks. About 8 in 10 people feel better by 6 weeks.  CAUSES Some common causes of back pain include:  Strain of the muscles or ligaments supporting the spine.  Wear and tear (degeneration) of the spinal discs.  Arthritis.  Direct injury to the back. DIAGNOSIS Most of the time, the direct cause of low back pain is not known.However, back pain can be treated effectively even when the exact cause of the pain is unknown.Answering your caregiver's questions about your overall health and symptoms is one of the most accurate ways to make sure the cause of your pain is not dangerous. If your caregiver needs more information, he or she may order lab work or imaging tests (X-rays or MRIs).However, even if imaging tests show changes in your back, this usually does not require surgery. HOME CARE INSTRUCTIONS For many people, back pain returns.Since low back pain is rarely dangerous, it is often a condition that people can learn to manageon their own.   Remain active. It is stressful on the back to sit or stand in one place. Do not sit, drive, or stand in one place for more than 30 minutes at a time. Take short walks on level surfaces as soon as pain allows.Try to increase the length of time you walk each day.  Do not stay in bed.Resting more than 1 or 2 days can delay your recovery.  Do not avoid exercise or work.Your body is made to move.It is not dangerous to be active, even though your back may hurt.Your back will likely heal faster if you return to being active before your pain is gone.  Pay attention to your body when you bend and lift. Many people have less discomfortwhen lifting if they bend their knees, keep the load close to their bodies,and  avoid twisting. Often, the most comfortable positions are those that put less stress on your recovering back.  Find a comfortable position to sleep. Use a firm mattress and lie on your side with your knees slightly bent. If you lie on your back, put a pillow under your knees.  Only take over-the-counter or prescription medicines as directed by your caregiver. Over-the-counter medicines to reduce pain and inflammation are often the most helpful.Your caregiver may prescribe muscle relaxant drugs.These medicines help dull your pain so you can more quickly return to your normal activities and healthy exercise.  Put ice on the injured area.  Put ice in a plastic bag.  Place a towel between your skin and the bag.  Leave the ice on for 15-20 minutes, 03-04 times a day for the first 2 to 3 days. After that, ice and heat may be alternated to reduce pain and spasms.  Ask your caregiver about trying back exercises and gentle massage. This may be of some benefit.  Avoid feeling anxious or stressed.Stress increases muscle tension and can worsen back pain.It is important to recognize when you are anxious or stressed and learn ways to manage it.Exercise is a great option. SEEK MEDICAL CARE IF:  You have pain that is not relieved with rest or medicine.  You have pain that does not improve in 1 week.  You have new symptoms.  You are generally not feeling well. SEEK   IMMEDIATE MEDICAL CARE IF:   You have pain that radiates from your back into your legs.  You develop new bowel or bladder control problems.  You have unusual weakness or numbness in your arms or legs.  You develop nausea or vomiting.  You develop abdominal pain.  You feel faint. Document Released: 01/25/2005 Document Revised: 07/27/2011 Document Reviewed: 05/29/2013 [employer] Patient Information 2015 [employer]. This information is not intended to replace advice given to you by your health care provider. Make sure you  discuss any questions you have with your health care provider.  

## 2013-12-20 NOTE — ED Notes (Signed)
Pt c/o "slide down 11 stairs this am, c/o lower back pain and neck pain

## 2013-12-20 NOTE — ED Notes (Signed)
Patient transported to X-ray 

## 2013-12-20 NOTE — ED Provider Notes (Signed)
CSN: [id number]     Arrival date & time 12/20/13  1634 History   First MD Initiated Contact with Patient 12/20/13 1929     This chart was scribed for Linwood DibblesJon Jerianne Anselmo, MD by Miguel Aguilar, ED Scribe. This patient was seen in room MH04/MH04 and the patient's care was started 8:53 PM.   Chief Complaint  Patient presents with  . Fall   Patient is a 48 y.o. male presenting with fall. The history is provided by the patient and a relative. No language interpreter was used.  Fall This is a new problem. The current episode started 6 to 12 hours ago. The problem occurs rarely. The problem has been gradually worsening. Nothing relieves the symptoms. He has tried a cold compress, a warm compress and water for the symptoms. The treatment provided no relief.    HPI Comments: Miguel Aguilar is a 48 y.o. male with a PMHx of bipolar 1 disorder, ADHD, hyperlipidemia, GERD, HTN, and scoliosis who presents to the Emergency Department complaining of a fall sustained earlier this morning. Pt states he slipped going down 14 stairs. He now c/o constant, moderate lower back pain and neck pain that is progressively worsening. Miguel Aguilar describes discomfort as burning. Pain is exacerbated with certain movements without any alleviating factors at this time. He has tried cold and heat application to areas of discomfort without any improvement for symptoms. He has also attempted soaking in a warm bath with no relief. Pt is unable to take NSAIDS due to known allergic reactions. He denies any fever of chills. No loss of sensation, numbness, or weakness.  Past Medical History  Diagnosis Date  . Bipolar 1 disorder   . ADHD (attention deficit hyperactivity disorder)   . Hyperlipemia   . GERD (gastroesophageal reflux disease)   . Hypertension   . Head trauma approx 1982  . 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 Anginal pain     hx of  . Hernia   . Anxiety   . Depression   . Pneumonia     hx of  . Seizures      few months  . Headache(784.0)   . Arthritis   . Scoliosis   . Hx MRSA infection   . Deafness in left ear    Past Surgical History  Procedure Laterality Date  . Laparoscopic appendectomy Right 05/12/2012    Procedure: APPENDECTOMY LAPAROSCOPIC;  Surgeon: Romie LeveeAlicia Thomas, MD;  Location: [facility];  Service: General;  Laterality: Right;  . Hernia repair Bilateral     x10  . Inguinal hernia repair Right 09/05/2012    Procedure: LAPAROSCOPIC INGUINAL HERNIA;  Surgeon: Axel FillerArmando Ramirez, MD;  Location: [facility];  Service: General;  Laterality: Right;  . Insertion of mesh Right 09/05/2012    Procedure: INSERTION OF MESH;  Surgeon: Axel FillerArmando Ramirez, MD;  Location: [facility];  Service: General;  Laterality: Right;   Family History  Problem Relation Age of Onset  . Adopted: Yes   History  Substance Use Topics  . Smoking status: Former Smoker -- 0.25 packs/day for 36 years    Types: Cigarettes    Quit date: 09/20/2012  . Smokeless tobacco: Former NeurosurgeonUser    Types: Chew    Quit date: 05/12/1993  . Alcohol Use: No     Comment: occasional    Review of Systems  Constitutional: Negative for fever and chills.  Musculoskeletal: Positive for back pain and neck pain.  Neurological: Negative for weakness  and numbness.  All other systems reviewed and are negative.     Allergies  Aspirin; Ibuprofen; Naproxen; and Nsaids  Home Medications   Prior to Admission medications   Medication Sig Start Date End Date Taking? Authorizing Provider  amLODipine (NORVASC) 2.5 MG tablet Take 2.5 mg by mouth every evening.     Historical Provider, MD  chlorhexidine (PERIDEX) 0.12 % solution  07/10/12   Historical Provider, MD  divalproex (DEPAKOTE) 250 MG DR tablet Take 250 mg by mouth 3 (three) times daily.    Historical Provider, MD  EPIPEN 2-PAK 0.3 MG/0.3ML SOAJ 0.3 mg once.  03/21/12   Historical Provider, MD  gabapentin (NEURONTIN) 400 MG capsule Take 400 mg by mouth 3 (three) times daily.    Historical Provider, MD   HYDROcodone-acetaminophen (NORCO/VICODIN) 5-325 MG per tablet Take 1-2 tablets by mouth every 4 (four) hours as needed. 12/20/13   Linwood DibblesJon Shaylon Gillean, MD  hydrOXYzine (VISTARIL) 50 MG capsule Take 100 mg by mouth 4 (four) times daily as needed for anxiety.  06/22/12   Historical Provider, MD  levofloxacin (LEVAQUIN) 500 MG tablet Take 1 tablet (500 mg total) by mouth daily. 06/19/13   Warnell Foresterrey Wofford, MD  mirtazapine (REMERON) 30 MG tablet Take 30 mg by mouth at bedtime.    Historical Provider, MD  omeprazole (PRILOSEC) 20 MG capsule Take 20 mg by mouth daily.    Historical Provider, MD  ondansetron (ZOFRAN) 4 MG tablet Take 1 tablet (4 mg total) by mouth every 6 (six) hours. 06/19/13   Warnell Foresterrey Wofford, MD  PATADAY 0.2 % SOLN Place 1 drop into both eyes daily.  06/19/12   Historical Provider, MD  pravastatin (PRAVACHOL) 20 MG tablet Take 20 mg by mouth every evening.     Historical Provider, MD  traZODone (DESYREL) 50 MG tablet Take 200 mg by mouth at bedtime.  06/22/12   Historical Provider, MD  triamcinolone (KENALOG) 0.1 % paste  08/14/12   Historical Provider, MD   Triage Vitals: BP 138/95 mmHg  Pulse 73  Temp(Src) 97.9 F (36.6 C)  Resp 16  Ht 6\' 5"  (1.956 m)  Wt 236 lb (107.049 kg)  BMI 27.98 kg/m2  SpO2 98%   Physical Exam  Constitutional: He appears well-developed and well-nourished. No distress.  HENT:  Head: Normocephalic and atraumatic.  Right Ear: External ear normal.  Left Ear: External ear normal.  Eyes: Conjunctivae are normal. Right eye exhibits no discharge. Left eye exhibits no discharge. No scleral icterus.  Neck: Neck supple. No tracheal deviation present.  Cardiovascular: Normal rate, regular rhythm and intact distal pulses.   Pulmonary/Chest: Effort normal and breath sounds normal. No stridor. No respiratory distress. He has no wheezes. He has no rales.  Abdominal: Soft. Bowel sounds are normal. He exhibits no distension. There is no tenderness. There is no rebound and no guarding.   Musculoskeletal: He exhibits no edema.       Cervical back: He exhibits decreased range of motion, tenderness and bony tenderness. He exhibits no swelling.       Thoracic back: He exhibits tenderness and bony tenderness. He exhibits no swelling.       Lumbar back: He exhibits tenderness and bony tenderness. He exhibits no swelling.  All 4 extremities FROM and normal strength. No Tenderness to palpation   Neurological: He is alert. He has normal strength. No cranial nerve deficit (no facial droop, extraocular movements intact, no slurred speech) or sensory deficit. He exhibits normal muscle tone. He displays no seizure  activity. Coordination normal.  Skin: Skin is warm and dry. No rash noted.  Psychiatric: He has a normal mood and affect.  Nursing note and vitals reviewed.   ED Course  Procedures (including critical care time)  DIAGNOSTIC STUDIES: Oxygen Saturation is 100% on RA, Normal by my interpretation.    COORDINATION OF CARE: 8:53 PM- Will order DG cervical spine complete, DG lumbar spine complete, and DG thoracic spine 2 view. Will give dilaudid injection to help manage symptoms .Discussed treatment plan with pt at bedside and pt agreed to plan.     Labs Review Labs Reviewed - No data to display  Imaging Review Dg Cervical Spine Complete  12/20/2013   CLINICAL DATA:  Fall down stairs with neck pain.  EXAM: CERVICAL SPINE  4+ VIEWS  COMPARISON:  None.  FINDINGS: Vertebral body alignment and heights are within normal. There is moderate spondylosis present over the mid to lower cervical spine. Disc space narrowing is present at the C5-6 and C6-7 levels. Prevertebral soft tissues are normal. Bilateral neural foraminal narrowing is present at multiple levels worse at the C3-4 level bilaterally. Mild uncovertebral joint spurrings present. The C1-2 articulation is within normal. There is no acute fracture or subluxation.  IMPRESSION: No acute findings.  Mild to moderate spondylosis of the  cervical spine with disc disease at C5-6 and C6-7 levels. Multilevel neural foraminal narrowing worse at the C3-4 level.   Electronically Signed   By: Elberta Fortis M.D.   On: 12/20/2013 17:55   Dg Thoracic Spine 2 View  12/20/2013   CLINICAL DATA:  Left back pain after falling down 11 stairs today.  EXAM: THORACIC SPINE - 2 VIEW  COMPARISON:  Chest radiographs dated 08/29/2012.  FINDINGS: Minimal scoliosis. No fractures or subluxations. Lower cervical spine degenerative changes. Upper lumbar spine degenerative changes.  IMPRESSION: 1. No fracture or subluxation. 2. Cervical spine and lumbar spine degenerative changes.   Electronically Signed   By: Gordan Payment M.D.   On: 12/20/2013 20:21   Dg Lumbar Spine Complete  12/20/2013   CLINICAL DATA:  Patient fell down 11 stairs. Neck pain. Left arm and shoulder pain. Mid to upper back pain. History of scoliosis.  EXAM: LUMBAR SPINE - COMPLETE 4+ VIEW  COMPARISON:  CT 06/18/2013  FINDINGS: There is degenerative change at L1-2. No acute fracture or subluxation. No suspicious lytic or blastic lesions identified. Regional bowel gas pattern is nonobstructive.  IMPRESSION: No evidence for acute  abnormality.   Electronically Signed   By: Rosalie Gums M.D.   On: 12/20/2013 17:56     MDM   Final diagnoses:  Cervical strain, acute, initial encounter  Lumbar strain, initial encounter  Acute thoracic myofascial strain, initial encounter    Consistent with muscle strain.  No fracture on xray.  Dc home with pain meds.  I personally performed the services described in this documentation, which was scribed in my presence. The recorded information has been reviewed and is accurate.    Linwood Dibbles, MD 12/20/13 [phone]

## 2013-12-20 NOTE — ED Notes (Signed)
MD at bedside discussing test results. 

## 2014-02-13 ENCOUNTER — Encounter (HOSPITAL_BASED_OUTPATIENT_CLINIC_OR_DEPARTMENT_OTHER): Payer: Self-pay | Admitting: *Deleted

## 2014-02-13 ENCOUNTER — Emergency Department (HOSPITAL_BASED_OUTPATIENT_CLINIC_OR_DEPARTMENT_OTHER)
Admission: EM | Admit: 2014-02-13 | Discharge: 2014-02-14 | Disposition: A | Discharge status: Home / Self Care | Payer: Medicare Other | Attending: Emergency Medicine | Admitting: Emergency Medicine

## 2014-02-13 DIAGNOSIS — I209 Angina pectoris, unspecified: Secondary | ICD-10-CM | POA: Diagnosis not present

## 2014-02-13 DIAGNOSIS — Z79899 Other long term (current) drug therapy: Secondary | ICD-10-CM | POA: Diagnosis not present

## 2014-02-13 DIAGNOSIS — F419 Anxiety disorder, unspecified: Secondary | ICD-10-CM | POA: Insufficient documentation

## 2014-02-13 DIAGNOSIS — H9192 Unspecified hearing loss, left ear: Secondary | ICD-10-CM | POA: Diagnosis not present

## 2014-02-13 DIAGNOSIS — M1990 Unspecified osteoarthritis, unspecified site: Secondary | ICD-10-CM | POA: Insufficient documentation

## 2014-02-13 DIAGNOSIS — G40909 Epilepsy, unspecified, not intractable, without status epilepticus: Secondary | ICD-10-CM | POA: Diagnosis not present

## 2014-02-13 DIAGNOSIS — Z8701 Personal history of pneumonia (recurrent): Secondary | ICD-10-CM | POA: Insufficient documentation

## 2014-02-13 DIAGNOSIS — F319 Bipolar disorder, unspecified: Secondary | ICD-10-CM | POA: Insufficient documentation

## 2014-02-13 DIAGNOSIS — Z87891 Personal history of nicotine dependence: Secondary | ICD-10-CM | POA: Insufficient documentation

## 2014-02-13 DIAGNOSIS — T402X5A Adverse effect of other opioids, initial encounter: Secondary | ICD-10-CM | POA: Insufficient documentation

## 2014-02-13 DIAGNOSIS — E785 Hyperlipidemia, unspecified: Secondary | ICD-10-CM | POA: Insufficient documentation

## 2014-02-13 DIAGNOSIS — Z792 Long term (current) use of antibiotics: Secondary | ICD-10-CM | POA: Diagnosis not present

## 2014-02-13 DIAGNOSIS — K219 Gastro-esophageal reflux disease without esophagitis: Secondary | ICD-10-CM | POA: Insufficient documentation

## 2014-02-13 DIAGNOSIS — Z8619 Personal history of other infectious and parasitic diseases: Secondary | ICD-10-CM | POA: Diagnosis not present

## 2014-02-13 DIAGNOSIS — T7840XA Allergy, unspecified, initial encounter: Secondary | ICD-10-CM

## 2014-02-13 DIAGNOSIS — R21 Rash and other nonspecific skin eruption: Secondary | ICD-10-CM | POA: Insufficient documentation

## 2014-02-13 MED ORDER — FAMOTIDINE 20 MG PO TABS
40.0000 mg | ORAL_TABLET | Freq: Once | ORAL | Status: AC
  Administered 2014-02-14: 40 mg via ORAL
  Filled 2014-02-13: qty 2

## 2014-02-13 NOTE — ED Provider Notes (Signed)
CSN: [id number]     Arrival date & time 02/13/14  2308 History   This chart was scribed for Savita Runner Smitty CordsK Roselynne Lortz-Rasch, MD by Evon Slackerrance Branch, ED Scribe. This patient was seen in room MH03/MH03 and the patient's care was started at 11:45 PM.      Chief Complaint  Patient presents with  . Rash    Patient is a 49 y.o. male presenting with rash. The history is provided by the patient. No language interpreter was used.  Rash Location:  Torso Torso rash location:  Upper back and lower back Quality: itchiness and redness   Severity:  Moderate Onset quality:  Sudden Duration:  2 hours Timing:  Constant Progression:  Unchanged Chronicity:  New Context: not animal contact, not eggs and not new detergent/soap   Relieved by:  None tried Worsened by:  Nothing tried Ineffective treatments:  None tried Associated symptoms: no abdominal pain, no throat swelling and no tongue swelling    HPI Comments: Miguel Aguilar is a 49 y.o. male who presents to the Emergency Department complaining of rash to his abdomen and back onset 2 hours PTA. Pt states the rash is red and itchy. Pt states that he has recently had shoulder surgery 1 week ago. Pt states that he has been taking hydrocodone for his shoulder pain. Pt denies any other symptoms. Pt denies any new clothing.   Past Medical History  Diagnosis Date  . Bipolar 1 disorder   . ADHD (attention deficit hyperactivity disorder)   . Hyperlipemia   . GERD (gastroesophageal reflux disease)   . Hypertension   . Head trauma approx 1982  . 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 Anginal pain     hx of  . Hernia   . Anxiety   . Depression   . Pneumonia     hx of  . Seizures     few months  . Headache(784.0)   . Arthritis   . Scoliosis   . Hx MRSA infection   . Deafness in left ear    Past Surgical History  Procedure Laterality Date  . Laparoscopic appendectomy Right 05/12/2012    Procedure: APPENDECTOMY LAPAROSCOPIC;  Surgeon: Romie LeveeAlicia  Thomas, MD;  Location: [facility];  Service: General;  Laterality: Right;  . Hernia repair Bilateral     x10  . Inguinal hernia repair Right 09/05/2012    Procedure: LAPAROSCOPIC INGUINAL HERNIA;  Surgeon: Axel FillerArmando Ramirez, MD;  Location: [facility];  Service: General;  Laterality: Right;  . Insertion of mesh Right 09/05/2012    Procedure: INSERTION OF MESH;  Surgeon: Axel FillerArmando Ramirez, MD;  Location: [facility];  Service: General;  Laterality: Right;   Family History  Problem Relation Age of Onset  . Adopted: Yes   History  Substance Use Topics  . Smoking status: Former Smoker -- 0.25 packs/day for 36 years    Types: Cigarettes    Quit date: 09/20/2012  . Smokeless tobacco: Former NeurosurgeonUser    Types: Chew    Quit date: 05/12/1993  . Alcohol Use: No     Comment: occasional    Review of Systems  Gastrointestinal: Negative for abdominal pain.  Skin: Positive for rash.  All other systems reviewed and are negative.    Allergies  Aspirin; Ibuprofen; Naproxen; and Nsaids  Home Medications   Prior to Admission medications   Medication Sig Start Date End Date Taking? Authorizing Provider  amLODipine (NORVASC) 2.5 MG tablet Take 2.5 mg by mouth  every evening.     Historical Provider, MD  chlorhexidine (PERIDEX) 0.12 % solution  07/10/12   Historical Provider, MD  divalproex (DEPAKOTE) 250 MG DR tablet Take 250 mg by mouth 3 (three) times daily.    Historical Provider, MD  EPIPEN 2-PAK 0.3 MG/0.3ML SOAJ 0.3 mg once.  03/21/12   Historical Provider, MD  gabapentin (NEURONTIN) 400 MG capsule Take 400 mg by mouth 3 (three) times daily.    Historical Provider, MD  HYDROcodone-acetaminophen (NORCO/VICODIN) 5-325 MG per tablet Take 1-2 tablets by mouth every 4 (four) hours as needed. 12/20/13   Linwood Dibbles, MD  hydrOXYzine (VISTARIL) 50 MG capsule Take 100 mg by mouth 4 (four) times daily as needed for anxiety.  06/22/12   Historical Provider, MD  levofloxacin (LEVAQUIN) 500 MG tablet Take 1 tablet (500 mg total)  by mouth daily. 06/19/13   Candyce Churn III, MD  mirtazapine (REMERON) 30 MG tablet Take 30 mg by mouth at bedtime.    Historical Provider, MD  omeprazole (PRILOSEC) 20 MG capsule Take 20 mg by mouth daily.    Historical Provider, MD  ondansetron (ZOFRAN) 4 MG tablet Take 1 tablet (4 mg total) by mouth every 6 (six) hours. 06/19/13   Candyce Churn III, MD  PATADAY 0.2 % SOLN Place 1 drop into both eyes daily.  06/19/12   Historical Provider, MD  pravastatin (PRAVACHOL) 20 MG tablet Take 20 mg by mouth every evening.     Historical Provider, MD  traZODone (DESYREL) 50 MG tablet Take 200 mg by mouth at bedtime.  06/22/12   Historical Provider, MD  triamcinolone (KENALOG) 0.1 % paste  08/14/12   Historical Provider, MD   Triage Vitals: BP 133/95 mmHg  Pulse 89  Temp(Src) 98.1 F (36.7 C) (Oral)  Resp 16  Wt 236 lb (107.049 kg)  SpO2 97%  Physical Exam  Constitutional: He is oriented to person, place, and time. He appears well-developed and well-nourished. No distress.  HENT:  Head: Normocephalic and atraumatic.  Mouth/Throat: Oropharynx is clear and moist. No oropharyngeal exudate.  No swelling of tongue, lips or throat.   Eyes: Conjunctivae and EOM are normal. Pupils are equal, round, and reactive to light.  Neck: Normal range of motion. Neck supple. No tracheal deviation present.  Cardiovascular: Normal rate and regular rhythm.   Pulmonary/Chest: Effort normal and breath sounds normal. No stridor. No respiratory distress. He has no wheezes. He has no rales.  Abdominal: Soft. Bowel sounds are normal. There is no tenderness.  Musculoskeletal: Normal range of motion.  Neurological: He is alert and oriented to person, place, and time.  Skin: Skin is warm and dry. Rash noted. Rash is macular.  Diffuse macular irruption.   Psychiatric: He has a normal mood and affect. His behavior is normal.  Nursing note and vitals reviewed.   ED Course  Procedures (including critical care  time) DIAGNOSTIC STUDIES: Oxygen Saturation is 97% on RA, normal by my interpretation.    COORDINATION OF CARE: 11:52 PM-Discussed treatment plan with pt at bedside and pt agreed to plan.     Labs Review Labs Reviewed - No data to display  Imaging Review No results found.   EKG Interpretation None      MDM   Final diagnoses:  None    Will start on Steroids and pepcid.  Will need to discuss with his surgeon regarding steroids in the perioperative period.    I personally performed the services described in this documentation, which  was scribed in my presence. The recorded information has been reviewed and is accurate.      Annalia Metzger Smitty Cords, MD 02/14/14 0010

## 2014-02-13 NOTE — ED Notes (Signed)
Pt c/o rash to abd x 2 hrs

## 2014-02-14 ENCOUNTER — Encounter (HOSPITAL_BASED_OUTPATIENT_CLINIC_OR_DEPARTMENT_OTHER): Payer: Self-pay | Admitting: Emergency Medicine

## 2014-02-14 DIAGNOSIS — R21 Rash and other nonspecific skin eruption: Secondary | ICD-10-CM | POA: Diagnosis not present

## 2014-02-14 MED ORDER — PREDNISONE 50 MG PO TABS: 50.0000 mg | ORAL_TABLET | Freq: Every day | ORAL | Status: AC

## 2014-02-14 MED ORDER — FAMOTIDINE 20 MG PO TABS: 20.0000 mg | ORAL_TABLET | Freq: Two times a day (BID) | ORAL | Status: AC

## 2014-02-14 NOTE — Discharge Instructions (Signed)
Allergy Shots  Frequently Asked Questions  Allergy shots are a treatment used to help lessen allergy symptoms such as:  · Sneezing.  · Itchy, watery eyes.  · Runny, stuffy nose.  · Asthma.  WHAT MAY BE IN MY ALLERGY SHOT?  · Grass, tree, and weed pollens.  · Insects.  · Animal dander (old skin scales which your animal is always shedding).  · Dust mites.  · Molds.  HOW DO ALLERGY SHOTS HELP MY ALLERGY SYMPTOMS?   Allergy shots "turn down" your body's reaction to the things you are allergic to.  HOW OFTEN DO I GET MY ALLERGY SHOT?  Every week until you safely "build up" to your "maintenance dose."  WHAT IS THE MAINTENANCE DOSE?  It is the dose that gives you the most relief from your allergy symptoms.  HOW LONG DOES IT TAKE TO GET TO A MAINTENANCE DOSE?  If you keep all your appointments and you have no reactions, it may take 5 to 7 months.  WHAT IF I MISS AN ALLERGY SHOT?  Missing appointments makes it take longer for you to get to your maintenance dose. It is very important to keep all your appointments. If you miss one, make another appointment and tell the nurse at your next appointment.  WHAT ARE SOME OF THE COMMON REACTIONS TO ALLERGY SHOTS?  Most common reactions include redness and puffiness (swelling) where the shot was given. This reaction is mild and goes away on its own. Less common reactions are:  · Itchy eyes, nose, or throat.  · Sneezing, runny nose.  · Red bumps (hives).  · Trouble breathing.  · Coughing.  · Wheezing.  · Scratchy throat.  · Tightness in the chest.  Caution: If you notice any reaction within 24 hours, report this to the allergy nurse before getting your next shot.  WHY MUST I STAY AFTER I GET MY ALLERGY SHOT?  For your safety, you must stay in the clinic for up to 30 minutes after getting the allergy shot. Before you leave, the nurse will check for any reaction at the place where the shot was given.  WHEN WILL MY ALLERGY SYMPTOMS GET BETTER?  Allergy shots begin to work shortly after  you begin treatment, but your allergy symptoms may not get better for 4 to 6 months.  WHAT DO I DO IF I FEEL SICK ON THE DAY OF MY ALLERGY SHOT?  Tell the nurse before you get your shot.  WHEN MIGHT I STOP GETTING MY ALLERGY SHOTS?  · Usually after you have been getting allergy shots for about 3 to 5 years.  · If the shots do not work for you.  · If you start taking a type of medicine called a beta blocker. (Beta blockers are commonly used for lowering blood pressure.)  · If you miss many of the appointments for your shots.  · If you do not follow the instructions given to you by your allergy clinic staff.  Tell your doctor if you start any new medicines while you are getting allergy shots.  GET HELP RIGHT AWAY IF:  · You have trouble breathing.  · You have red bumps on your skin within 24 hours after your shot.  Document Released: 11/04/2007 Document Revised: 04/19/2011 Document Reviewed: 11/04/2007  [employer]® Patient Information ©2015 [employer]. This information is not intended to replace advice given to you by your health care provider. Make sure you discuss any questions you have with your health care provider.

## 2014-07-23 IMAGING — CT CT ABD-PELV W/ CM
2 of 5 series · 16 of 46 positions shown, 18 images · IV contrast (APPLIED)
Comparison: 05/12/2012

CLINICAL DATA: Low abdominal pain

EXAM:
CT ABDOMEN AND PELVIS WITH CONTRAST
TECHNIQUE: Multidetector CT imaging of the abdomen and pelvis was performed
using the standard protocol following bolus administration of
intravenous contrast.
CONTRAST:  50mL OMNIPAQUE IOHEXOL 300 MG/ML SOLN, 100mL OMNIPAQUE
IOHEXOL 300 MG/ML SOLN

[Series 2: abd/pelvis 5.0 b31f · axial · 0.78mm/px · z∈[-448,+12]mm · 13 of 104 slices shown, 15 images]
[im 6/104  soft-tissue]
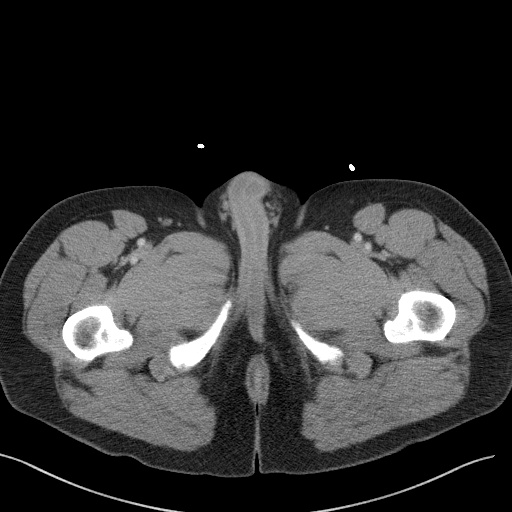
[im 6/104  bone]
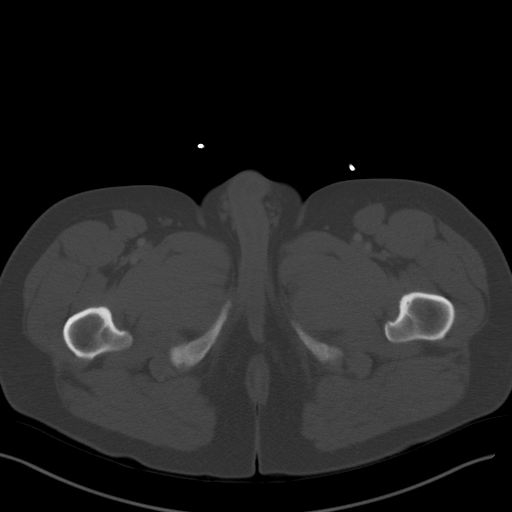
[im 17/104  soft-tissue]
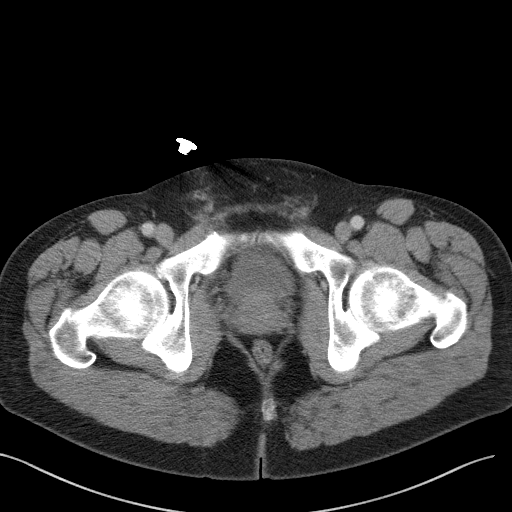
[im 22/104  soft-tissue]
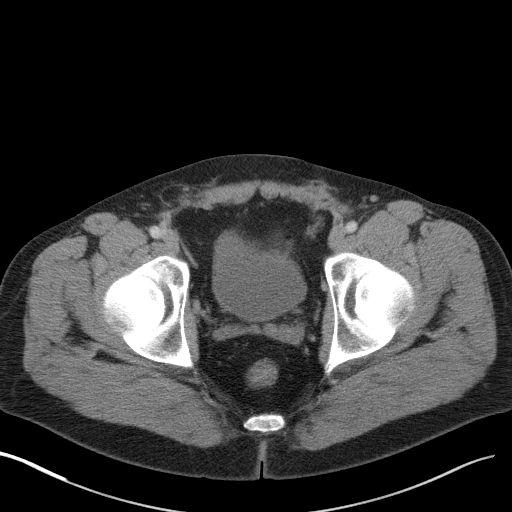
[im 28/104  soft-tissue]
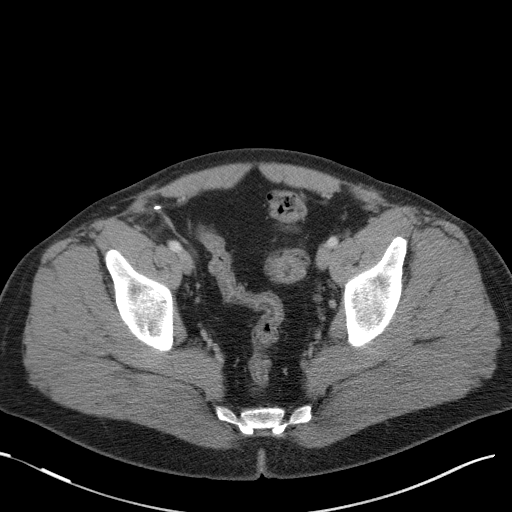
[im 38/104  soft-tissue]
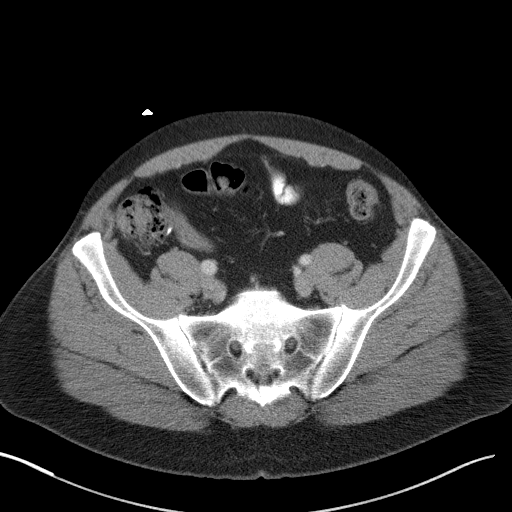
[im 44/104  soft-tissue]
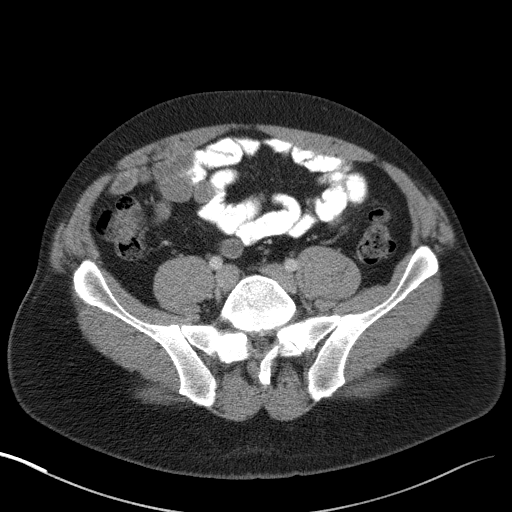
[im 55/104  soft-tissue]
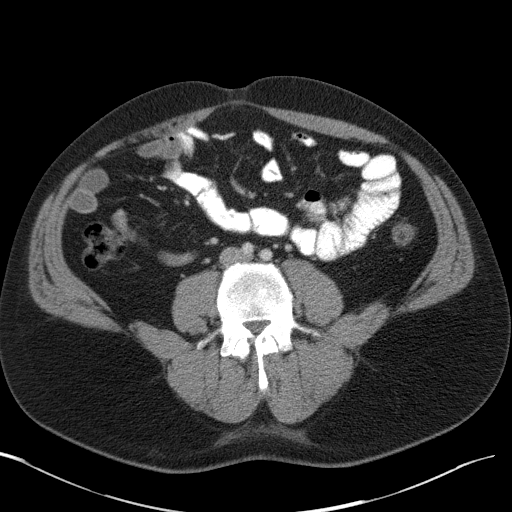
[im 60/104  soft-tissue]
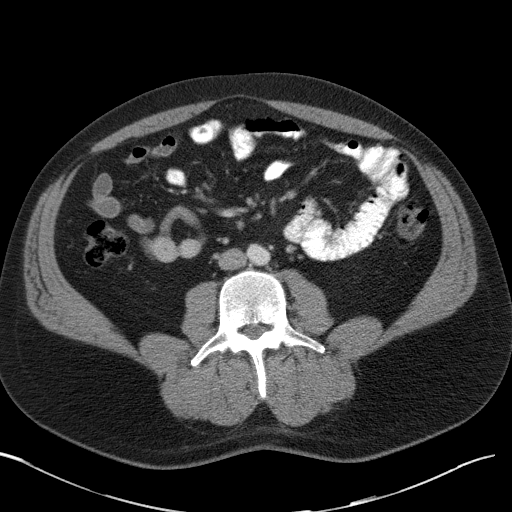
[im 66/104  soft-tissue]
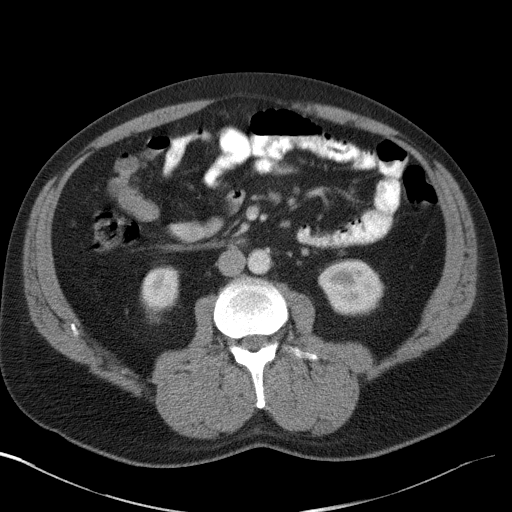
[im 66/104  bone]
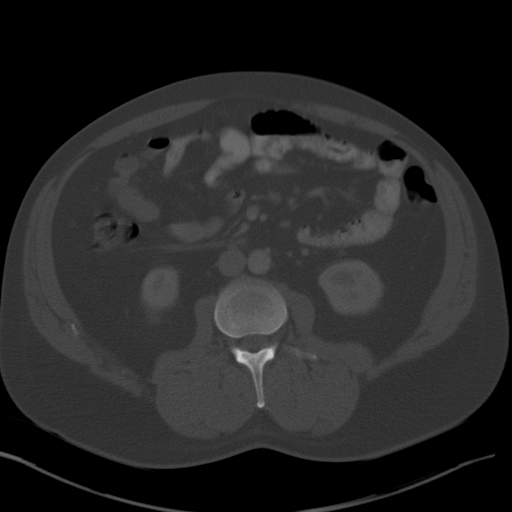
[im 76/104  soft-tissue]
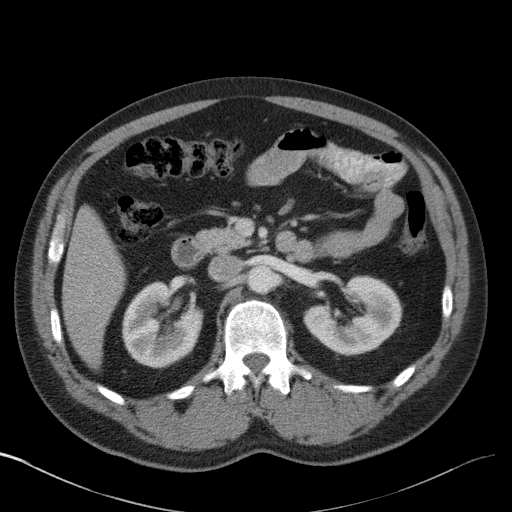
[im 82/104  soft-tissue]
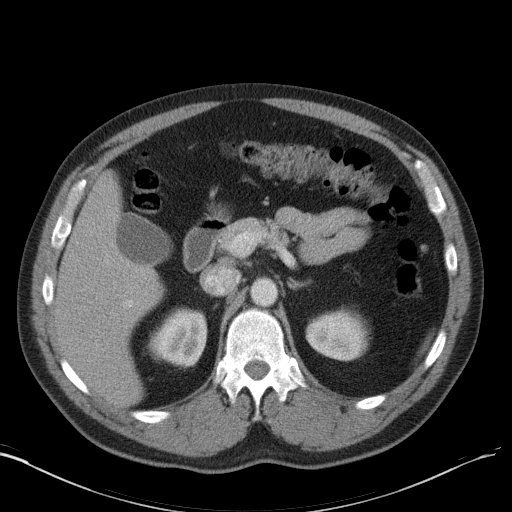
[im 87/104  soft-tissue]
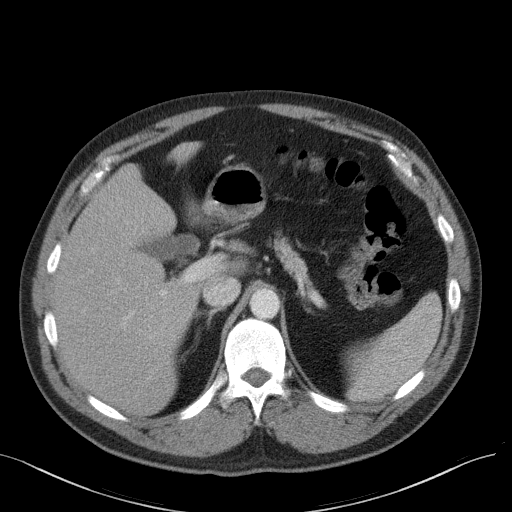
[im 98/104  soft-tissue]
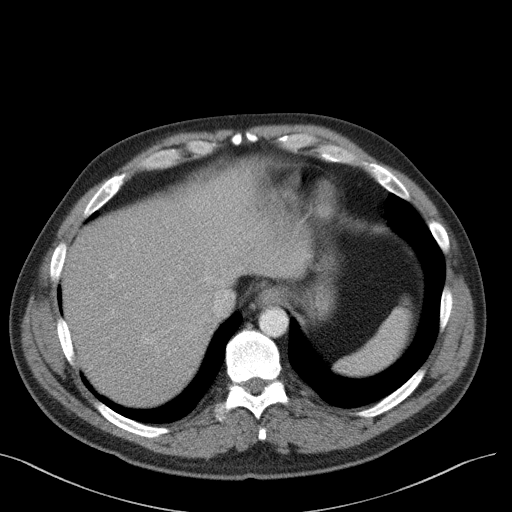

[Series 6: abd/pelvis 3.0 coronal · coronal · 0.81mm/px · 3 of 101 slices shown]
[im 34/101  soft-tissue]
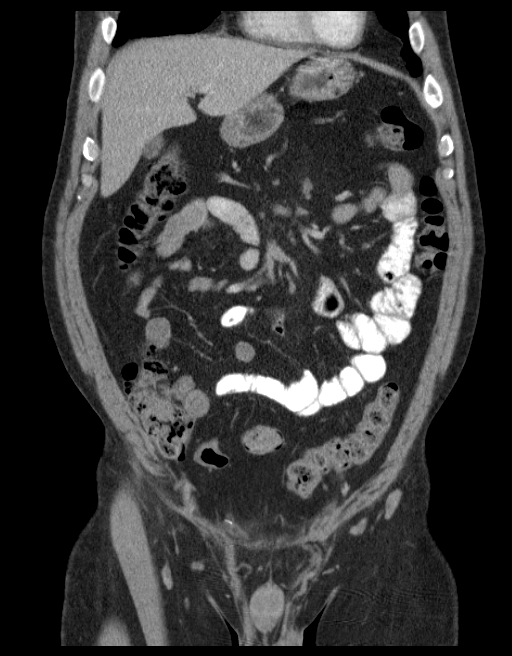
[im 45/101  soft-tissue]
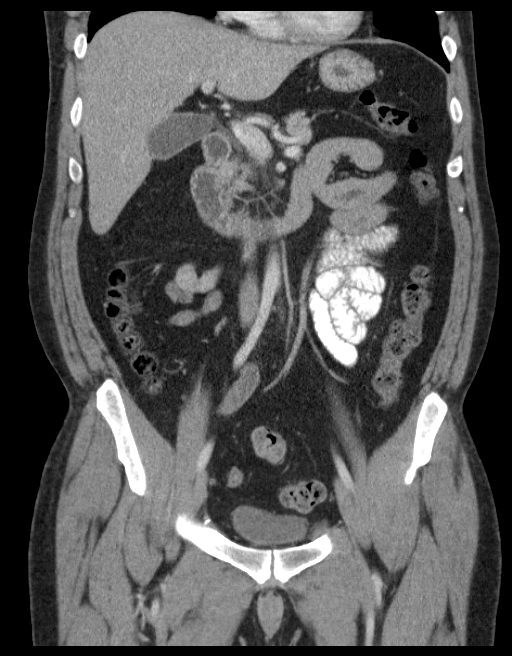
[im 56/101  soft-tissue]
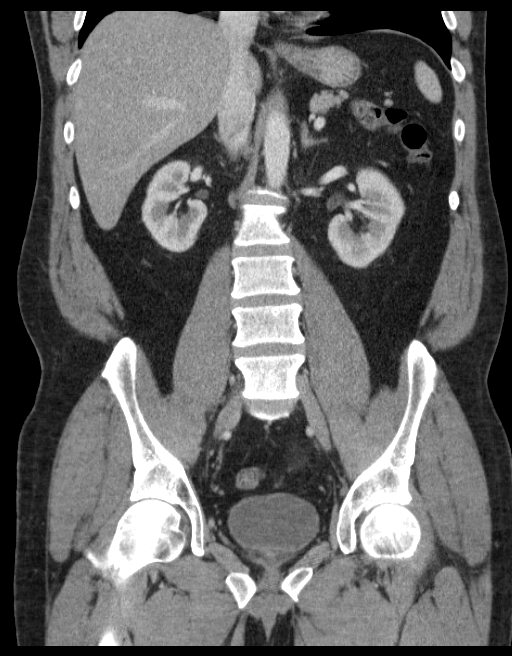

[16 of 46 positions shown; findings below may reference images not displayed]

FINDINGS: The lung bases are free of acute infiltrate or sizable effusion.

The liver, gallbladder, spleen, adrenal glands and pancreas are all
normal in their CT appearance the kidneys are well visualized
bilaterally with normal enhancement pattern. No renal calculi or
obstructive changes are noted. The bladder is well distended. No
pelvic mass lesion is noted. Changes consistent with prior
appendectomy are noted. Mild diverticulosis is seen without evidence
of diverticulitis. Scarring is noted related to previous hernia
repair particularly on the left. No acute bony abnormality is noted.
IMPRESSION: No acute abnormality seen.

## 2014-09-10 ENCOUNTER — Emergency Department (HOSPITAL_BASED_OUTPATIENT_CLINIC_OR_DEPARTMENT_OTHER)
Admission: EM | Admit: 2014-09-10 | Discharge: 2014-09-10 | Disposition: A | Discharge status: Home / Self Care | Payer: Medicare Other | Attending: Emergency Medicine | Admitting: Emergency Medicine

## 2014-09-10 ENCOUNTER — Encounter (HOSPITAL_BASED_OUTPATIENT_CLINIC_OR_DEPARTMENT_OTHER): Payer: Self-pay | Admitting: Emergency Medicine

## 2014-09-10 DIAGNOSIS — Z792 Long term (current) use of antibiotics: Secondary | ICD-10-CM | POA: Diagnosis not present

## 2014-09-10 DIAGNOSIS — Z8674 Personal history of sudden cardiac arrest: Secondary | ICD-10-CM | POA: Diagnosis not present

## 2014-09-10 DIAGNOSIS — N289 Disorder of kidney and ureter, unspecified: Secondary | ICD-10-CM | POA: Insufficient documentation

## 2014-09-10 DIAGNOSIS — M791 Myalgia: Secondary | ICD-10-CM | POA: Diagnosis not present

## 2014-09-10 DIAGNOSIS — F419 Anxiety disorder, unspecified: Secondary | ICD-10-CM | POA: Diagnosis not present

## 2014-09-10 DIAGNOSIS — I10 Essential (primary) hypertension: Secondary | ICD-10-CM | POA: Insufficient documentation

## 2014-09-10 DIAGNOSIS — K219 Gastro-esophageal reflux disease without esophagitis: Secondary | ICD-10-CM | POA: Diagnosis not present

## 2014-09-10 DIAGNOSIS — E876 Hypokalemia: Secondary | ICD-10-CM | POA: Diagnosis not present

## 2014-09-10 DIAGNOSIS — R7989 Other specified abnormal findings of blood chemistry: Secondary | ICD-10-CM | POA: Diagnosis present

## 2014-09-10 DIAGNOSIS — Z79899 Other long term (current) drug therapy: Secondary | ICD-10-CM | POA: Insufficient documentation

## 2014-09-10 DIAGNOSIS — E785 Hyperlipidemia, unspecified: Secondary | ICD-10-CM | POA: Diagnosis not present

## 2014-09-10 DIAGNOSIS — M1990 Unspecified osteoarthritis, unspecified site: Secondary | ICD-10-CM | POA: Diagnosis not present

## 2014-09-10 DIAGNOSIS — H9192 Unspecified hearing loss, left ear: Secondary | ICD-10-CM | POA: Diagnosis not present

## 2014-09-10 DIAGNOSIS — F909 Attention-deficit hyperactivity disorder, unspecified type: Secondary | ICD-10-CM | POA: Insufficient documentation

## 2014-09-10 DIAGNOSIS — R252 Cramp and spasm: Secondary | ICD-10-CM

## 2014-09-10 DIAGNOSIS — G40909 Epilepsy, unspecified, not intractable, without status epilepticus: Secondary | ICD-10-CM | POA: Insufficient documentation

## 2014-09-10 DIAGNOSIS — Z791 Long term (current) use of non-steroidal anti-inflammatories (NSAID): Secondary | ICD-10-CM | POA: Diagnosis not present

## 2014-09-10 DIAGNOSIS — I209 Angina pectoris, unspecified: Secondary | ICD-10-CM | POA: Diagnosis not present

## 2014-09-10 DIAGNOSIS — Z8701 Personal history of pneumonia (recurrent): Secondary | ICD-10-CM | POA: Diagnosis not present

## 2014-09-10 DIAGNOSIS — Z87891 Personal history of nicotine dependence: Secondary | ICD-10-CM | POA: Diagnosis not present

## 2014-09-10 DIAGNOSIS — M79606 Pain in leg, unspecified: Secondary | ICD-10-CM | POA: Diagnosis not present

## 2014-09-10 DIAGNOSIS — M419 Scoliosis, unspecified: Secondary | ICD-10-CM | POA: Diagnosis not present

## 2014-09-10 DIAGNOSIS — F319 Bipolar disorder, unspecified: Secondary | ICD-10-CM | POA: Diagnosis not present

## 2014-09-10 DIAGNOSIS — Z8614 Personal history of Methicillin resistant Staphylococcus aureus infection: Secondary | ICD-10-CM | POA: Diagnosis not present

## 2014-09-10 DIAGNOSIS — Z7952 Long term (current) use of systemic steroids: Secondary | ICD-10-CM | POA: Diagnosis not present

## 2014-09-10 LAB — CBC
HCT: 40.6 % (ref 39.0–52.0)
Hemoglobin: 13.7 g/dL (ref 13.0–17.0)
MCH: 31 pg (ref 26.0–34.0)
MCHC: 33.7 g/dL (ref 30.0–36.0)
MCV: 91.9 fL (ref 78.0–100.0)
PLATELETS: 265 10*3/uL (ref 150–400)
RBC: 4.42 MIL/uL (ref 4.22–5.81)
RDW: 13.7 % (ref 11.5–15.5)
WBC: 11.6 10*3/uL — AB (ref 4.0–10.5)

## 2014-09-10 LAB — CK: Total CK: 120 U/L (ref 49–397)

## 2014-09-10 LAB — I-STAT CHEM 8, ED
BUN: 12 mg/dL (ref 6–20)
CALCIUM ION: 1.26 mmol/L — AB (ref 1.12–1.23)
Chloride: 103 mmol/L (ref 101–111)
Creatinine, Ser: 1.3 mg/dL — ABNORMAL HIGH (ref 0.61–1.24)
GLUCOSE: 149 mg/dL — AB (ref 65–99)
HEMATOCRIT: 42 % (ref 39.0–52.0)
Hemoglobin: 14.3 g/dL (ref 13.0–17.0)
Potassium: 3.2 mmol/L — ABNORMAL LOW (ref 3.5–5.1)
Sodium: 141 mmol/L (ref 135–145)
TCO2: 23 mmol/L (ref 0–100)

## 2014-09-10 LAB — URINALYSIS, ROUTINE W REFLEX MICROSCOPIC
Bilirubin Urine: NEGATIVE
GLUCOSE, UA: NEGATIVE mg/dL
Hgb urine dipstick: NEGATIVE
Ketones, ur: NEGATIVE mg/dL
Leukocytes, UA: NEGATIVE
NITRITE: NEGATIVE
PH: 6.5 (ref 5.0–8.0)
Protein, ur: NEGATIVE mg/dL
SPECIFIC GRAVITY, URINE: 1.002 — AB (ref 1.005–1.030)
Urobilinogen, UA: 0.2 mg/dL (ref 0.0–1.0)

## 2014-09-10 MED ORDER — MORPHINE SULFATE 4 MG/ML IJ SOLN
4.0000 mg | Freq: Once | INTRAMUSCULAR | Status: AC
  Administered 2014-09-10: 4 mg via INTRAVENOUS
  Filled 2014-09-10: qty 1

## 2014-09-10 MED ORDER — SODIUM CHLORIDE 0.9 % IV BOLUS (SEPSIS)
1000.0000 mL | Freq: Once | INTRAVENOUS | Status: AC
  Administered 2014-09-10: 1000 mL via INTRAVENOUS

## 2014-09-10 MED ORDER — ONDANSETRON HCL 4 MG/2ML IJ SOLN
4.0000 mg | Freq: Once | INTRAMUSCULAR | Status: AC
  Administered 2014-09-10: 4 mg via INTRAVENOUS
  Filled 2014-09-10: qty 2

## 2014-09-10 NOTE — ED Provider Notes (Signed)
CSN: [id number]     Arrival date & time 09/10/14  1611 History   First MD Initiated Contact with Patient 09/10/14 1631     Chief Complaint  Patient presents with  . Abnormal Lab     (Consider location/radiation/quality/duration/timing/severity/associated sxs/prior Treatment) HPI  Pt presenting with c/o bilateral leg cramping.  He states one week ago due to working in the heat he started having leg cramps.  This has continued over the past week.  His calves are both very painful.  He saw his PMD on 7/26 for these symptoms.  Was told today that his labs were abnormal and advised to come to the ED for lab recheck, IV fluids and possible admission.  Labs from that draw show creatinine 3- no hx of renal insufficiency.  Pt has been drinking liquids, no vomiting.  States he has been having loose stools for approx 1 year.  Has been using voltaren gel on his calf muscles without much relief.  There are no other associated systemic symptoms, there are no other alleviating or modifying factors. Symptoms are constant and severe.    Past Medical History  Diagnosis Date  . Bipolar 1 disorder   . ADHD (attention deficit hyperactivity disorder)   . Hyperlipemia   . GERD (gastroesophageal reflux disease)   . Hypertension   . Head trauma approx 1982  . 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 Anginal pain     hx of  . Hernia   . Anxiety   . Depression   . Pneumonia     hx of  . Seizures     few months  . Headache(784.0)   . Arthritis   . Scoliosis   . Hx MRSA infection   . Deafness in left ear    Past Surgical History  Procedure Laterality Date  . Laparoscopic appendectomy Right 05/12/2012    Procedure: APPENDECTOMY LAPAROSCOPIC;  Surgeon: Romie Levee, MD;  Location: [facility];  Service: General;  Laterality: Right;  . Hernia repair Bilateral     x10  . Inguinal hernia repair Right 09/05/2012    Procedure: LAPAROSCOPIC INGUINAL HERNIA;  Surgeon: Axel Filler, MD;  Location: [facility];   Service: General;  Laterality: Right;  . Insertion of mesh Right 09/05/2012    Procedure: INSERTION OF MESH;  Surgeon: Axel Filler, MD;  Location: [facility];  Service: General;  Laterality: Right;   Family History  Problem Relation Age of Onset  . Adopted: Yes   History  Substance Use Topics  . Smoking status: Former Smoker -- 0.25 packs/day for 36 years    Types: Cigarettes    Quit date: 09/20/2012  . Smokeless tobacco: Former Neurosurgeon    Types: Chew    Quit date: 05/12/1993  . Alcohol Use: No     Comment: occasional    Review of Systems  ROS reviewed and all otherwise negative except for mentioned in HPI    Allergies  Aspirin; Ibuprofen; Naproxen; and Nsaids  Home Medications   Prior to Admission medications   Medication Sig Start Date End Date Taking? Authorizing Provider  ALPRAZolam Prudy Feeler) 0.25 MG tablet Take 0.25 mg by mouth at bedtime as needed for anxiety.   Yes Historical Provider, MD  amLODipine (NORVASC) 2.5 MG tablet Take 2.5 mg by mouth every evening.     Historical Provider, MD  chlorhexidine (PERIDEX) 0.12 % solution  07/10/12   Historical Provider, MD  diclofenac sodium (VOLTAREN) 1 % GEL Apply topically  4 (four) times daily.   Yes Historical Provider, MD  divalproex (DEPAKOTE) 250 MG DR tablet Take 250 mg by mouth 3 (three) times daily.    Historical Provider, MD  EPIPEN 2-PAK 0.3 MG/0.3ML SOAJ 0.3 mg once.  03/21/12   Historical Provider, MD  famotidine (PEPCID) 20 MG tablet Take 1 tablet (20 mg total) by mouth 2 (two) times daily. 02/14/14   April Palumbo, MD  gabapentin (NEURONTIN) 400 MG capsule Take 400 mg by mouth 3 (three) times daily.    Historical Provider, MD  HYDROcodone-acetaminophen (NORCO/VICODIN) 5-325 MG per tablet Take 1-2 tablets by mouth every 4 (four) hours as needed. 12/20/13   Linwood Dibbles, MD  hydrOXYzine (VISTARIL) 50 MG capsule Take 100 mg by mouth 4 (four) times daily as needed for anxiety.  06/22/12  Yes Historical Provider, MD  levofloxacin  (LEVAQUIN) 500 MG tablet Take 1 tablet (500 mg total) by mouth daily. 06/19/13   Blake Divine, MD  methylphenidate (RITALIN) 10 MG tablet Take 10 mg by mouth 2 (two) times daily.   Yes Historical Provider, MD  mirtazapine (REMERON) 30 MG tablet Take 30 mg by mouth at bedtime.    Historical Provider, MD  omeprazole (PRILOSEC) 20 MG capsule Take 20 mg by mouth daily.    Historical Provider, MD  ondansetron (ZOFRAN) 4 MG tablet Take 1 tablet (4 mg total) by mouth every 6 (six) hours. 06/19/13   Blake Divine, MD  PATADAY 0.2 % SOLN Place 1 drop into both eyes daily.  06/19/12   Historical Provider, MD  pravastatin (PRAVACHOL) 20 MG tablet Take 20 mg by mouth every evening.    Yes Historical Provider, MD  predniSONE (DELTASONE) 50 MG tablet Take 1 tablet (50 mg total) by mouth daily with breakfast. 02/14/14   April Palumbo, MD  tiZANidine (ZANAFLEX) 4 MG capsule Take 4 mg by mouth 3 (three) times daily.   Yes Historical Provider, MD  traZODone (DESYREL) 50 MG tablet Take 200 mg by mouth at bedtime.  06/22/12   Historical Provider, MD  triamcinolone (KENALOG) 0.1 % paste  08/14/12   Historical Provider, MD   BP 126/79 mmHg  Pulse 71  Temp(Src) 98.2 F (36.8 C) (Oral)  Resp 18  Ht  (1.956 m)  Wt 213 lb (96.616 kg)  BMI 25.25 kg/m2  SpO2 100%  Vitals reviewed Physical Exam  Physical Examination: General appearance - alert, well appearing, and in no distress Mental status - alert, oriented to person, place, and time Eyes - no conjunctival injection, no scleral icterus Mouth - mucous membranes moist, pharynx normal without lesions Chest - clear to auscultation, no wheezes, rales or rhonchi, symmetric air entry Heart - normal rate, regular rhythm, normal S1, S2, no murmurs, rubs, clicks or gallops Abdomen - soft, nontender, nondistended, no masses or organomegaly Neurological - alert, oriented, normal speech,  Extremities - peripheral pulses normal, no pedal edema, no clubbing or cyanosis,  tenderness to palpation over bilateral calves, calves are symmetric, no pitting edema Skin - normal coloration and turgor, no rashes  ED Course  Procedures (including critical care time) Labs Review Labs Reviewed  CBC - Abnormal; Notable for the following:    WBC 11.6 (*)    All other components within normal limits  URINALYSIS, ROUTINE W REFLEX MICROSCOPIC (NOT AT [facility]) - Abnormal; Notable for the following:    Specific Gravity, Urine 1.002 (*)    All other components within normal limits  I-STAT CHEM 8, ED - Abnormal; Notable for the  following:    Potassium 3.2 (*)    Creatinine, Ser 1.30 (*)    Glucose, Bld 149 (*)    Calcium, Ion 1.26 (*)    All other components within normal limits  CK    Imaging Review No results found.   EKG Interpretation   Date/Time:  Tuesday September 10 2014 17:29:29 EDT Ventricular Rate:  91 PR Interval:  146 QRS Duration: 94 QT Interval:  364 QTC Calculation: 447 R Axis:   83 Text Interpretation:  Normal sinus rhythm Normal ECG No significant change  since last tracing Confirmed by [facility]  MD, MARTHA [phone]) on 09/10/2014  5:33:17 PM      MDM   Final diagnoses:  Renal insufficiency  Hypokalemia  Muscle cramping    Pt presenting with lower extremity cramping and pain, AKI found on blood work at PMD office from 7/26- he has continued to have leg cramping but has been drinking increased fluids.  Labs today show creatinine improved from 3 to 1.3- potassium is low at 3.2- EKG normal.  Pt's potassium repleted, 2 L NS given.  CK was normal, not in rhabdo.  Pt advised of these results, encouraged to continue po hydration at home, have labs rechecked in f/u with PMD in 2-3 days.  Discharged with strict return precautions.  Pt agreeable with plan.    Jerelyn Gabriella, MD 09/10/14 2000

## 2014-09-10 NOTE — Discharge Instructions (Signed)
Return to the ED with any concerns including fainting, difficulty breathing, vomiting and not able to keep down liquids, decreased level of alertness/lethargy, or any other alarming symptoms  You should continue to increase hydration and be sure to have your kidney function rechecked by your doctor's office in the next several days

## 2014-09-10 NOTE — ED Notes (Addendum)
Patient was seen at Primary care on the 26 th and was called today because of altered lab results. The patient was seen due to "heat exhaustion" with reports of leg cramps and generalized weakness. Patient was referred her to R/o Rhabdomyolysis.

## 2014-12-15 ENCOUNTER — Emergency Department (HOSPITAL_BASED_OUTPATIENT_CLINIC_OR_DEPARTMENT_OTHER)
Admission: EM | Admit: 2014-12-15 | Discharge: 2014-12-15 | Disposition: A | Discharge status: Home / Self Care | Payer: Medicare Other | Attending: Emergency Medicine | Admitting: Emergency Medicine

## 2014-12-15 ENCOUNTER — Encounter (HOSPITAL_BASED_OUTPATIENT_CLINIC_OR_DEPARTMENT_OTHER): Payer: Self-pay

## 2014-12-15 DIAGNOSIS — Z8674 Personal history of sudden cardiac arrest: Secondary | ICD-10-CM | POA: Insufficient documentation

## 2014-12-15 DIAGNOSIS — E785 Hyperlipidemia, unspecified: Secondary | ICD-10-CM | POA: Diagnosis not present

## 2014-12-15 DIAGNOSIS — G40909 Epilepsy, unspecified, not intractable, without status epilepticus: Secondary | ICD-10-CM | POA: Diagnosis not present

## 2014-12-15 DIAGNOSIS — Z8614 Personal history of Methicillin resistant Staphylococcus aureus infection: Secondary | ICD-10-CM | POA: Diagnosis not present

## 2014-12-15 DIAGNOSIS — F909 Attention-deficit hyperactivity disorder, unspecified type: Secondary | ICD-10-CM | POA: Diagnosis not present

## 2014-12-15 DIAGNOSIS — Z79899 Other long term (current) drug therapy: Secondary | ICD-10-CM | POA: Insufficient documentation

## 2014-12-15 DIAGNOSIS — Z87891 Personal history of nicotine dependence: Secondary | ICD-10-CM | POA: Insufficient documentation

## 2014-12-15 DIAGNOSIS — Z7952 Long term (current) use of systemic steroids: Secondary | ICD-10-CM | POA: Diagnosis not present

## 2014-12-15 DIAGNOSIS — M1990 Unspecified osteoarthritis, unspecified site: Secondary | ICD-10-CM | POA: Diagnosis not present

## 2014-12-15 DIAGNOSIS — M419 Scoliosis, unspecified: Secondary | ICD-10-CM | POA: Insufficient documentation

## 2014-12-15 DIAGNOSIS — I209 Angina pectoris, unspecified: Secondary | ICD-10-CM | POA: Insufficient documentation

## 2014-12-15 DIAGNOSIS — H9192 Unspecified hearing loss, left ear: Secondary | ICD-10-CM | POA: Insufficient documentation

## 2014-12-15 DIAGNOSIS — F319 Bipolar disorder, unspecified: Secondary | ICD-10-CM | POA: Diagnosis not present

## 2014-12-15 DIAGNOSIS — L03211 Cellulitis of face: Secondary | ICD-10-CM | POA: Diagnosis not present

## 2014-12-15 DIAGNOSIS — K219 Gastro-esophageal reflux disease without esophagitis: Secondary | ICD-10-CM | POA: Diagnosis not present

## 2014-12-15 DIAGNOSIS — Z87828 Personal history of other (healed) physical injury and trauma: Secondary | ICD-10-CM | POA: Insufficient documentation

## 2014-12-15 DIAGNOSIS — I10 Essential (primary) hypertension: Secondary | ICD-10-CM | POA: Insufficient documentation

## 2014-12-15 DIAGNOSIS — Z8701 Personal history of pneumonia (recurrent): Secondary | ICD-10-CM | POA: Insufficient documentation

## 2014-12-15 DIAGNOSIS — F419 Anxiety disorder, unspecified: Secondary | ICD-10-CM | POA: Insufficient documentation

## 2014-12-15 DIAGNOSIS — L0201 Cutaneous abscess of face: Secondary | ICD-10-CM | POA: Diagnosis present

## 2014-12-15 MED ORDER — CLINDAMYCIN HCL 300 MG PO CAPS: 300.0000 mg | ORAL_CAPSULE | Freq: Four times a day (QID) | ORAL | Status: AC

## 2014-12-15 MED ORDER — HYDROCODONE-ACETAMINOPHEN 5-325 MG PO TABS: 1.0000 | ORAL_TABLET | ORAL | Status: AC | PRN

## 2014-12-15 MED ORDER — CLINDAMYCIN HCL 150 MG PO CAPS
300.0000 mg | ORAL_CAPSULE | Freq: Once | ORAL | Status: AC
  Administered 2014-12-15: 300 mg via ORAL
  Filled 2014-12-15: qty 2

## 2014-12-15 NOTE — ED Notes (Signed)
2x2 dressing placed to left lower side of face by Dr. Ranae PalmsYelverton after drainage of abscess. Dressing dry and intact at d/c home.

## 2014-12-15 NOTE — ED Notes (Signed)
MD at bedside. 

## 2014-12-15 NOTE — ED Provider Notes (Signed)
CSN: [id number]     Arrival date & time 12/15/14  0144 History   First MD Initiated Contact with Patient 12/15/14 [phone]     Chief Complaint  Patient presents with  . Abscess     (Consider location/radiation/quality/duration/timing/severity/associated sxs/prior Treatment) HPI Patient presents with several hours of left lower jaw swelling, redness and pain. Denies fevers or chills. Denies dental pain. Significant other states there has been purulent drainage from the site. No previous history of abscesses Past Medical History  Diagnosis Date  . Bipolar 1 disorder (HCC)   . ADHD (attention deficit hyperactivity disorder)   . Hyperlipemia   . GERD (gastroesophageal reflux disease)   . Hypertension   . Head trauma approx 1982  . 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 Anginal pain (HCC)     hx of  . Hernia   . Anxiety   . Depression   . Pneumonia     hx of  . Seizures (HCC)     few months  . Headache(784.0)   . Arthritis   . Scoliosis   . Hx MRSA infection   . Deafness in left ear    Past Surgical History  Procedure Laterality Date  . Laparoscopic appendectomy Right 05/12/2012    Procedure: APPENDECTOMY LAPAROSCOPIC;  Surgeon: Romie Levee, MD;  Location: [facility];  Service: General;  Laterality: Right;  . Hernia repair Bilateral     x10  . Inguinal hernia repair Right 09/05/2012    Procedure: LAPAROSCOPIC INGUINAL HERNIA;  Surgeon: Axel Filler, MD;  Location: [facility];  Service: General;  Laterality: Right;  . Insertion of mesh Right 09/05/2012    Procedure: INSERTION OF MESH;  Surgeon: Axel Filler, MD;  Location: [facility];  Service: General;  Laterality: Right;   Family History  Problem Relation Age of Onset  . Adopted: Yes   Social History  Substance Use Topics  . Smoking status: Former Smoker -- 0.25 packs/day for 36 years    Types: Cigarettes    Quit date: 09/20/2012  . Smokeless tobacco: Former Neurosurgeon    Types: Chew    Quit date: 05/12/1993  . Alcohol  Use: No     Comment: occasional    Review of Systems  Constitutional: Negative for fever and chills.  HENT: Negative for dental problem.   Skin: Positive for color change.  All other systems reviewed and are negative.     Allergies  Aspirin; Ibuprofen; Naproxen; and Nsaids  Home Medications   Prior to Admission medications   Medication Sig Start Date End Date Taking? Authorizing Provider  ALPRAZolam Prudy Feeler) 0.25 MG tablet Take 0.25 mg by mouth at bedtime as needed for anxiety.    Historical Provider, MD  amLODipine (NORVASC) 2.5 MG tablet Take 2.5 mg by mouth every evening.     Historical Provider, MD  chlorhexidine (PERIDEX) 0.12 % solution  07/10/12   Historical Provider, MD  clindamycin (CLEOCIN) 300 MG capsule Take 1 capsule (300 mg total) by mouth 4 (four) times daily. X 7 days 12/15/14   Loren Racer, MD  diclofenac sodium (VOLTAREN) 1 % GEL Apply topically 4 (four) times daily.    Historical Provider, MD  divalproex (DEPAKOTE) 250 MG DR tablet Take 250 mg by mouth 3 (three) times daily.    Historical Provider, MD  EPIPEN 2-PAK 0.3 MG/0.3ML SOAJ 0.3 mg once.  03/21/12   Historical Provider, MD  famotidine (PEPCID) 20 MG tablet Take 1 tablet (20 mg total) by  mouth 2 (two) times daily. 02/14/14   April Palumbo, MD  gabapentin (NEURONTIN) 400 MG capsule Take 400 mg by mouth 3 (three) times daily.    Historical Provider, MD  HYDROcodone-acetaminophen (NORCO/VICODIN) 5-325 MG tablet Take 1-2 tablets by mouth every 4 (four) hours as needed. 12/15/14   Loren Raceravid Hoa Deriso, MD  hydrOXYzine (VISTARIL) 50 MG capsule Take 100 mg by mouth 4 (four) times daily as needed for anxiety.  06/22/12   Historical Provider, MD  levofloxacin (LEVAQUIN) 500 MG tablet Take 1 tablet (500 mg total) by mouth daily. 06/19/13   Blake DivineJohn Wofford, MD  methylphenidate (RITALIN) 10 MG tablet Take 10 mg by mouth 2 (two) times daily.    Historical Provider, MD  mirtazapine (REMERON) 30 MG tablet Take 30 mg by mouth at bedtime.     Historical Provider, MD  omeprazole (PRILOSEC) 20 MG capsule Take 20 mg by mouth daily.    Historical Provider, MD  ondansetron (ZOFRAN) 4 MG tablet Take 1 tablet (4 mg total) by mouth every 6 (six) hours. 06/19/13   Blake DivineJohn Wofford, MD  PATADAY 0.2 % SOLN Place 1 drop into both eyes daily.  06/19/12   Historical Provider, MD  pravastatin (PRAVACHOL) 20 MG tablet Take 20 mg by mouth every evening.     Historical Provider, MD  predniSONE (DELTASONE) 50 MG tablet Take 1 tablet (50 mg total) by mouth daily with breakfast. 02/14/14   April Palumbo, MD  tiZANidine (ZANAFLEX) 4 MG capsule Take 4 mg by mouth 3 (three) times daily.    Historical Provider, MD  traZODone (DESYREL) 50 MG tablet Take 200 mg by mouth at bedtime.  06/22/12   Historical Provider, MD  triamcinolone (KENALOG) 0.1 % paste  08/14/12   Historical Provider, MD   BP 135/73 mmHg  Pulse 80  Resp 15  SpO2 100% Physical Exam  Constitutional: He is oriented to person, place, and time. He appears well-developed and well-nourished. No distress.  HENT:  Head: Normocephalic and atraumatic.  Mouth/Throat: Oropharynx is clear and moist.  The patient has erythematous mass to the left jawline. Induration without fluctuance. No spontaneous drainage. No intraoral abscesses noted. Patient with poor dentition throughout.  Eyes: EOM are normal. Pupils are equal, round, and reactive to light.  Neck: Normal range of motion. Neck supple.  Cardiovascular: Normal rate and regular rhythm.   Pulmonary/Chest: Effort normal and breath sounds normal. No respiratory distress. He has no wheezes. He has no rales.  Abdominal: Soft. Bowel sounds are normal. He exhibits no distension and no mass. There is no tenderness. There is no rebound and no guarding.  Musculoskeletal: Normal range of motion. He exhibits no edema or tenderness.  Lymphadenopathy:    He has no cervical adenopathy.  Neurological: He is alert and oriented to person, place, and time.  Skin: Skin is  warm and dry. No rash noted. There is erythema.  Psychiatric: He has a normal mood and affect. His behavior is normal.  Nursing note and vitals reviewed.   ED Course  .Marland Kitchen.Incision and Drainage Date/Time: 12/15/2014 3:13 AM Performed by: Loren RacerYELVERTON, Chasya Zenz Authorized by: Ranae PalmsYELVERTON, Korbyn Vanes Consent: Verbal consent obtained. Patient identity confirmed: verbally with patient Time out: Immediately prior to procedure a "time out" was called to verify the correct patient, procedure, equipment, support staff and site/side marked as required. Type: abscess Body area: head Location details: face Local anesthetic: lidocaine 1% without epinephrine Anesthetic total: 2 ml Patient sedated: no Risk factor: underlying major vessel Scalpel size: 11 Needle gauge: 22  Incision type: single straight Incision depth: dermal Complexity: simple Drainage: bloody Drainage amount: scant Wound treatment: wound left open Packing material: none Patient tolerance: Patient tolerated the procedure well with no immediate complications Comments: No abscess identified. Wound covered   (including critical care time) Labs Review Labs Reviewed - No data to display  Imaging Review No results found. I have personally reviewed and evaluated these images and lab results as part of my medical decision-making.   EKG Interpretation None      MDM   Final diagnoses:  Facial cellulitis    Patient with left lower facial swelling. Induration without definite abscess. Given clindamycin in the emergency department. Will start on course have follow-up with his primary physician. Return precautions have been given.    Loren Racer, MD 12/15/14 [phone]

## 2014-12-15 NOTE — ED Notes (Signed)
Pt arrived to ED during Epic Daylight Savings Time Upgrade Downtime. Please see paper chart for complete documentation.

## 2014-12-15 NOTE — Discharge Instructions (Signed)

## 2014-12-15 NOTE — ED Notes (Signed)
Pt with left lower jaw abscess to outer skin after picking at an ingrown hair, redness noted.

## 2014-12-15 NOTE — ED Notes (Signed)
Report received 

## 2014-12-16 MED FILL — Hydrocodone-Acetaminophen Tab 5-325 MG: ORAL | Qty: 2 | Status: AC
